# Patient Record
Sex: Female | Born: 1985 | Hispanic: Yes | Marital: Single | Smoking: Former smoker
Health system: Southern US, Community
[De-identification: ages and names within clinical notes are randomized; demographics above are authoritative.]

## PROBLEM LIST (undated history)

## (undated) ENCOUNTER — Inpatient Hospital Stay (HOSPITAL_COMMUNITY): Payer: Self-pay

## (undated) DIAGNOSIS — Z9289 Personal history of other medical treatment: Secondary | ICD-10-CM

## (undated) DIAGNOSIS — I10 Essential (primary) hypertension: Secondary | ICD-10-CM

## (undated) DIAGNOSIS — Z8759 Personal history of other complications of pregnancy, childbirth and the puerperium: Secondary | ICD-10-CM

## (undated) DIAGNOSIS — D509 Iron deficiency anemia, unspecified: Secondary | ICD-10-CM

## (undated) DIAGNOSIS — Z973 Presence of spectacles and contact lenses: Secondary | ICD-10-CM

## (undated) DIAGNOSIS — Z8619 Personal history of other infectious and parasitic diseases: Secondary | ICD-10-CM

## (undated) HISTORY — PX: NO PAST SURGERIES: SHX2092

---

## 2017-09-13 HISTORY — PX: MOUTH SURGERY: SHX715

## 2017-09-13 NOTE — L&D Delivery Note (Signed)
Delivery Note At  1002 a viable female  was delivered via  (Presentation:vtx ; loa ).  APGAR:7 ,9 ; weight  .  pending Placenta status: , .  Cord:  with the following complications:none  Anesthesia:  epidural Episiotomy:   Lacerations:  first degree lac Suture Repair: 3.0 vicryl Est. Blood Loss (mL):  100  Mom to high risk floor  Baby to Couplet care / Skin to Skin.  Lori A Clemmons CNM 05/17/2018, 10:20 AM

## 2018-01-10 ENCOUNTER — Other Ambulatory Visit (HOSPITAL_COMMUNITY): Payer: Self-pay | Admitting: Obstetrics and Gynecology

## 2018-01-10 ENCOUNTER — Encounter (HOSPITAL_COMMUNITY): Payer: Self-pay

## 2018-01-10 DIAGNOSIS — Z3689 Encounter for other specified antenatal screening: Secondary | ICD-10-CM

## 2018-01-10 LAB — OB RESULTS CONSOLE RUBELLA ANTIBODY, IGM: RUBELLA: IMMUNE

## 2018-01-10 LAB — OB RESULTS CONSOLE ABO/RH: RH TYPE: POSITIVE

## 2018-01-10 LAB — OB RESULTS CONSOLE HIV ANTIBODY (ROUTINE TESTING): HIV: NONREACTIVE

## 2018-01-10 LAB — OB RESULTS CONSOLE HEPATITIS B SURFACE ANTIGEN: HEP B S AG: NEGATIVE

## 2018-01-10 LAB — OB RESULTS CONSOLE ANTIBODY SCREEN: Antibody Screen: NEGATIVE

## 2018-01-10 LAB — OB RESULTS CONSOLE RPR: RPR: NONREACTIVE

## 2018-01-24 ENCOUNTER — Encounter (HOSPITAL_COMMUNITY): Payer: Self-pay | Admitting: *Deleted

## 2018-01-25 ENCOUNTER — Ambulatory Visit (HOSPITAL_COMMUNITY)
Admission: RE | Admit: 2018-01-25 | Discharge: 2018-01-25 | Disposition: A | Discharge status: Home / Self Care | Payer: Medicaid Other | Source: Ambulatory Visit | Attending: Obstetrics and Gynecology | Admitting: Obstetrics and Gynecology

## 2018-01-25 ENCOUNTER — Other Ambulatory Visit (HOSPITAL_COMMUNITY): Payer: Self-pay | Admitting: *Deleted

## 2018-01-25 ENCOUNTER — Other Ambulatory Visit (HOSPITAL_COMMUNITY): Payer: Self-pay | Admitting: Obstetrics and Gynecology

## 2018-01-25 ENCOUNTER — Encounter (HOSPITAL_COMMUNITY): Payer: Self-pay

## 2018-01-25 DIAGNOSIS — Z3689 Encounter for other specified antenatal screening: Secondary | ICD-10-CM | POA: Insufficient documentation

## 2018-01-25 DIAGNOSIS — Z3A21 21 weeks gestation of pregnancy: Secondary | ICD-10-CM | POA: Insufficient documentation

## 2018-01-25 DIAGNOSIS — O10919 Unspecified pre-existing hypertension complicating pregnancy, unspecified trimester: Secondary | ICD-10-CM

## 2018-01-25 DIAGNOSIS — O99212 Obesity complicating pregnancy, second trimester: Secondary | ICD-10-CM | POA: Diagnosis not present

## 2018-01-25 DIAGNOSIS — O10912 Unspecified pre-existing hypertension complicating pregnancy, second trimester: Secondary | ICD-10-CM | POA: Insufficient documentation

## 2018-01-25 HISTORY — DX: Personal history of other medical treatment: Z92.89

## 2018-01-25 HISTORY — DX: Essential (primary) hypertension: I10

## 2018-01-25 NOTE — ED Notes (Signed)
Pt reports that she has not taken her labetalol this morning.  Will take med when she gets home.

## 2018-02-21 ENCOUNTER — Ambulatory Visit (HOSPITAL_COMMUNITY)
Admission: RE | Admit: 2018-02-21 | Discharge: 2018-02-21 | Disposition: A | Discharge status: Home / Self Care | Payer: Medicaid Other | Source: Ambulatory Visit | Attending: Obstetrics and Gynecology | Admitting: Obstetrics and Gynecology

## 2018-03-01 ENCOUNTER — Encounter (HOSPITAL_COMMUNITY): Payer: Self-pay

## 2018-03-01 ENCOUNTER — Ambulatory Visit (HOSPITAL_COMMUNITY)
Admission: RE | Admit: 2018-03-01 | Discharge: 2018-03-01 | Disposition: A | Discharge status: Home / Self Care | Payer: Medicaid Other | Source: Ambulatory Visit | Attending: Obstetrics and Gynecology | Admitting: Obstetrics and Gynecology

## 2018-03-01 DIAGNOSIS — Z3A26 26 weeks gestation of pregnancy: Secondary | ICD-10-CM | POA: Diagnosis not present

## 2018-03-01 DIAGNOSIS — O10912 Unspecified pre-existing hypertension complicating pregnancy, second trimester: Secondary | ICD-10-CM | POA: Insufficient documentation

## 2018-03-01 DIAGNOSIS — O99212 Obesity complicating pregnancy, second trimester: Secondary | ICD-10-CM | POA: Insufficient documentation

## 2018-03-01 DIAGNOSIS — O10919 Unspecified pre-existing hypertension complicating pregnancy, unspecified trimester: Secondary | ICD-10-CM

## 2018-04-27 ENCOUNTER — Ambulatory Visit (INDEPENDENT_AMBULATORY_CARE_PROVIDER_SITE_OTHER): Payer: Medicaid Other

## 2018-04-27 ENCOUNTER — Encounter: Payer: Self-pay | Admitting: General Practice

## 2018-04-27 MED ORDER — BETAMETHASONE SOD PHOS & ACET 6 (3-3) MG/ML IJ SUSP: 12.0000 mg | Freq: Once | INTRAMUSCULAR | Status: DC

## 2018-04-27 NOTE — Progress Notes (Signed)
Pts BP today was very high, r-138/93.Marland Kitchen.left-156/101, per provider have pt to go immediately to her OB Dr. Rock NephewPt verbalized understanding.

## 2018-04-27 NOTE — Progress Notes (Signed)
Gina LarsenAnna Cook here for Betamethasone  Injection.  Injection administered without complication. Patient will return in 24 hours for next injection.  Henrietta Dineamela S Neal, CMA 04/27/2018  11:26 AM

## 2018-04-28 ENCOUNTER — Inpatient Hospital Stay (HOSPITAL_COMMUNITY)
Admission: AD | Admit: 2018-04-28 | Discharge: 2018-04-28 | Disposition: A | Discharge status: Home / Self Care | Payer: Medicaid Other | Source: Ambulatory Visit | Attending: Obstetrics & Gynecology | Admitting: Obstetrics & Gynecology

## 2018-04-28 ENCOUNTER — Encounter (HOSPITAL_COMMUNITY): Payer: Self-pay | Admitting: *Deleted

## 2018-04-28 DIAGNOSIS — Z87891 Personal history of nicotine dependence: Secondary | ICD-10-CM | POA: Insufficient documentation

## 2018-04-28 DIAGNOSIS — Z7982 Long term (current) use of aspirin: Secondary | ICD-10-CM | POA: Insufficient documentation

## 2018-04-28 DIAGNOSIS — O99013 Anemia complicating pregnancy, third trimester: Secondary | ICD-10-CM | POA: Diagnosis not present

## 2018-04-28 DIAGNOSIS — O10013 Pre-existing essential hypertension complicating pregnancy, third trimester: Secondary | ICD-10-CM | POA: Diagnosis not present

## 2018-04-28 DIAGNOSIS — Z3A34 34 weeks gestation of pregnancy: Secondary | ICD-10-CM | POA: Insufficient documentation

## 2018-04-28 DIAGNOSIS — O10913 Unspecified pre-existing hypertension complicating pregnancy, third trimester: Secondary | ICD-10-CM

## 2018-04-28 DIAGNOSIS — R030 Elevated blood-pressure reading, without diagnosis of hypertension: Secondary | ICD-10-CM | POA: Diagnosis present

## 2018-04-28 DIAGNOSIS — D649 Anemia, unspecified: Secondary | ICD-10-CM | POA: Diagnosis not present

## 2018-04-28 LAB — COMPREHENSIVE METABOLIC PANEL
ALK PHOS: 158 U/L — AB (ref 38–126)
ALT: 13 U/L (ref 0–44)
AST: 20 U/L (ref 15–41)
Albumin: 2.8 g/dL — ABNORMAL LOW (ref 3.5–5.0)
Anion gap: 12 (ref 5–15)
BILIRUBIN TOTAL: 0.5 mg/dL (ref 0.3–1.2)
BUN: 10 mg/dL (ref 6–20)
CALCIUM: 8.8 mg/dL — AB (ref 8.9–10.3)
CO2: 19 mmol/L — ABNORMAL LOW (ref 22–32)
Chloride: 101 mmol/L (ref 98–111)
Creatinine, Ser: 0.67 mg/dL (ref 0.44–1.00)
GFR calc Af Amer: >60 mL/min (ref >60)
GLUCOSE: 144 mg/dL — AB (ref 70–99)
Potassium: 4.1 mmol/L (ref 3.5–5.1)
Sodium: 132 mmol/L — ABNORMAL LOW (ref 135–145)
TOTAL PROTEIN: 6.9 g/dL (ref 6.5–8.1)

## 2018-04-28 LAB — CBC
HEMATOCRIT: 29.3 % — AB (ref 36.0–46.0)
HEMOGLOBIN: 9.3 g/dL — AB (ref 12.0–15.0)
MCH: 23.1 pg — ABNORMAL LOW (ref 26.0–34.0)
MCHC: 31.7 g/dL (ref 30.0–36.0)
MCV: 72.9 fL — AB (ref 78.0–100.0)
Platelets: 321 10*3/uL (ref 150–400)
RBC: 4.02 MIL/uL (ref 3.87–5.11)
RDW: 18.1 % — ABNORMAL HIGH (ref 11.5–15.5)
WBC: 8.8 10*3/uL (ref 4.0–10.5)

## 2018-04-28 LAB — PROTEIN / CREATININE RATIO, URINE
Creatinine, Urine: 68 mg/dL
Protein Creatinine Ratio: 0.29 mg/mg{Cre} — ABNORMAL HIGH (ref 0.00–0.15)
Total Protein, Urine: 20 mg/dL

## 2018-04-28 MED ORDER — FERROUS SULFATE 325 (65 FE) MG PO TBEC: 325.0000 mg | DELAYED_RELEASE_TABLET | Freq: Two times a day (BID) | ORAL | 3 refills | Status: DC

## 2018-04-28 MED ORDER — BETAMETHASONE SOD PHOS & ACET 6 (3-3) MG/ML IJ SUSP
12.0000 mg | Freq: Once | INTRAMUSCULAR | Status: AC
  Administered 2018-04-28: 12 mg via INTRAMUSCULAR
  Filled 2018-04-28: qty 2

## 2018-04-28 MED ORDER — LABETALOL HCL 100 MG PO TABS: 200.0000 mg | ORAL_TABLET | Freq: Three times a day (TID) | ORAL | 1 refills | Status: DC

## 2018-04-28 NOTE — MAU Note (Signed)
Sent from office with elevated b/p 156/110. Denies any headache or visual problems

## 2018-04-28 NOTE — MAU Provider Note (Addendum)
Chief Complaint:  Hypertension   None    HPI: Gina Cook is a 32 y.o. [phone] at 3640w5d who presents to maternity admissions reporting increased BP in office.Marland Kitchen.  Hx of Gestational hypertension in each pregnancy.  Started BP medications early in this pregnancy.  Labetalol was increased to 200mg  in am and 300 mg in pm this week.  Denies headache, blurred vision or epigastric pain.  Pt states had Betamethasone yesterday at 1100.  Will get second dose today while here.   Denies contractions, leakage of fluid or vaginal bleeding. Good fetal movement.   Pregnancy Course:   Past Medical History:  Diagnosis Date  . History of blood transfusion   . Hypertension   . Postpartum hemorrhage 01/10/2018   OB History  Gravida Para Term Preterm AB Living  5 3 3   1 3   SAB TAB Ectopic Multiple Live Births  1            # Outcome Date GA Lbr Len/2nd Weight Sex Delivery Anes PTL Lv  5 Current           4 SAB           3 Term           2 Term           1 Term            Past Surgical History:  Procedure Laterality Date  . MOUTH SURGERY  09/13/2017   History reviewed. No pertinent family history. Social History   Tobacco Use  . Smoking status: Former Games developermoker  . Smokeless tobacco: Never Used  Substance Use Topics  . Alcohol use: Not Currently  . Drug use: Not Currently   No Known Allergies Facility-Administered Medications Prior to Admission  Medication Dose Route Frequency Provider Last Rate Last Dose  . betamethasone acetate-betamethasone sodium phosphate (CELESTONE) injection 12 mg  12 mg Intramuscular Once Nigel BridgemanLatham, Vicki, CNM       Medications Prior to Admission  Medication Sig Dispense Refill Last Dose  . ASPIRIN 81 PO Take by mouth.   Taking  . LABETALOL HCL PO Take by mouth.   Taking  . Prenatal Vit-Fe Fumarate-FA (PRENATAL VITAMIN PO) Take by mouth.   Taking    I have reviewed patient's Past Medical Hx, Surgical Hx, Family Hx, Social Hx, medications and allergies.   ROS:   Review of Systems  Constitutional: Negative.   HENT: Negative.   Eyes: Negative.   Respiratory: Negative.   Cardiovascular: Negative.   Gastrointestinal: Negative.   Endocrine: Negative.   Genitourinary: Negative.   Musculoskeletal: Negative.   Skin: Negative.   Allergic/Immunologic: Negative.   Neurological: Negative.   Hematological: Negative.   Psychiatric/Behavioral: Negative.     Physical Exam   Patient Vitals for the past 24 hrs:  BP Temp Pulse Resp  04/28/18 2131 (!) 155/94 - 91 -  04/28/18 2116 (!) 156/90 - 92 -  04/28/18 2101 125/82 - (!) 104 -  04/28/18 2046 (!) 151/86 - 97 -  04/28/18 2030 (!) 168/104 - 97 -  04/28/18 2015 (!) 160/100 - 93 -  04/28/18 2000 (!) 158/102 - 97 -  04/28/18 1946 (!) 146/100 - (!) 106 -  04/28/18 1931 (!) 148/96 - (!) 102 -  04/28/18 1914 (!) 155/97 98.2 F (36.8 C) 96 18  BP at discharge 137/79 Constitutional: Well-developed, well-nourished female in no acute distress.  Cardiovascular: normal rate Respiratory: normal effort GI: Abd soft, non-tender, gravid appropriate for gestational  age. Pos BS x 4 MS: Extremities nontender, no edema, normal ROM Neurologic: Alert and oriented x 4.  GU: Deferred    FHT:  Baseline 120 , moderate variability, accelerations present, no decelerations Contractions:None   Labs: Results for orders placed or performed during the hospital encounter of 04/28/18 (from the past 24 hour(s))  Protein / creatinine ratio, urine     Status: Abnormal   Collection Time: 04/28/18  7:23 PM  Result Value Ref Range   Creatinine, Urine 68.00 mg/dL   Total Protein, Urine 20 mg/dL   Protein Creatinine Ratio 0.29 (H) 0.00 - 0.15 mg/mg[Cre]  CBC     Status: Abnormal   Collection Time: 04/28/18  7:35 PM  Result Value Ref Range   WBC 8.8 4.0 - 10.5 K/uL   RBC 4.02 3.87 - 5.11 MIL/uL   Hemoglobin 9.3 (L) 12.0 - 15.0 g/dL   HCT 04.529.3 (L) 40.936.0 - 81.146.0 %   MCV 72.9 (L) 78.0 - 100.0 fL   MCH 23.1 (L) 26.0 - 34.0 pg    MCHC 31.7 30.0 - 36.0 g/dL   RDW 91.418.1 (H) 78.211.5 - 95.615.5 %   Platelets 321 150 - 400 K/uL  Comprehensive metabolic panel     Status: Abnormal   Collection Time: 04/28/18  7:35 PM  Result Value Ref Range   Sodium 132 (L) 135 - 145 mmol/L   Potassium 4.1 3.5 - 5.1 mmol/L   Chloride 101 98 - 111 mmol/L   CO2 19 (L) 22 - 32 mmol/L   Glucose, Bld 144 (H) 70 - 99 mg/dL   BUN 10 6 - 20 mg/dL   Creatinine, Ser 2.130.67 0.44 - 1.00 mg/dL   Calcium 8.8 (L) 8.9 - 10.3 mg/dL   Total Protein 6.9 6.5 - 8.1 g/dL   Albumin 2.8 (L) 3.5 - 5.0 g/dL   AST 20 15 - 41 U/L   ALT 13 0 - 44 U/L   Alkaline Phosphatase 158 (H) 38 - 126 U/L   Total Bilirubin 0.5 0.3 - 1.2 mg/dL   GFR calc non Af Amer >60 >60 mL/min   GFR calc Af Amer >60 >60 mL/min   Anion gap 12 5 - 15    Imaging:  No results found.  MAU Course: Orders Placed This Encounter  Procedures  . Protein / creatinine ratio, urine  . CBC  . Comprehensive metabolic panel  . Fetal monitoring  . Vital signs   No orders of the defined types were placed in this encounter.   MDM: PE, labs and nst reviewed.  Discussed poc with Dr. Normand Sloopillard.  Will increase Labetalol to 200mg  in am in mid day.  300 mg in pm.  Second dose of betamethasone.  Increase iron to tid Assessment: Chronic hypertension in pregnancy at 34.5 Anemia Reactive NST  Plan: Increased Labetalol to 3 x per day Reviewed pre eclampsia signs and symptoms. F/U Monday in office Increase Iron to TID Monitor BP at home  Discharge home in stable condition.   Labor precautions and fetal kick counts     Kenney Housemanrothero, Shandale Malak Jean, CNM 04/28/2018 9:44 PM

## 2018-05-04 ENCOUNTER — Telehealth (HOSPITAL_COMMUNITY): Payer: Self-pay | Admitting: *Deleted

## 2018-05-04 NOTE — Telephone Encounter (Signed)
Preadmission screen  

## 2018-05-10 ENCOUNTER — Other Ambulatory Visit: Payer: Self-pay | Admitting: Obstetrics & Gynecology

## 2018-05-17 ENCOUNTER — Inpatient Hospital Stay (HOSPITAL_COMMUNITY)
Admission: AD | Admit: 2018-05-17 | Discharge: 2018-05-19 | DRG: 807 | Disposition: A | Discharge status: Home / Self Care | Payer: Medicaid Other | Attending: Obstetrics and Gynecology | Admitting: Obstetrics and Gynecology

## 2018-05-17 ENCOUNTER — Inpatient Hospital Stay (HOSPITAL_COMMUNITY): Payer: Medicaid Other | Admitting: Anesthesiology

## 2018-05-17 ENCOUNTER — Encounter (HOSPITAL_COMMUNITY): Payer: Self-pay

## 2018-05-17 ENCOUNTER — Other Ambulatory Visit: Payer: Self-pay

## 2018-05-17 DIAGNOSIS — O114 Pre-existing hypertension with pre-eclampsia, complicating childbirth: Secondary | ICD-10-CM | POA: Diagnosis present

## 2018-05-17 DIAGNOSIS — O1002 Pre-existing essential hypertension complicating childbirth: Secondary | ICD-10-CM | POA: Diagnosis present

## 2018-05-17 DIAGNOSIS — Z3A37 37 weeks gestation of pregnancy: Secondary | ICD-10-CM

## 2018-05-17 DIAGNOSIS — O9081 Anemia of the puerperium: Secondary | ICD-10-CM

## 2018-05-17 DIAGNOSIS — O119 Pre-existing hypertension with pre-eclampsia, unspecified trimester: Secondary | ICD-10-CM

## 2018-05-17 DIAGNOSIS — Z87891 Personal history of nicotine dependence: Secondary | ICD-10-CM

## 2018-05-17 LAB — POCT FERN TEST: POCT FERN TEST: POSITIVE

## 2018-05-17 LAB — TYPE AND SCREEN
ABO/RH(D): O POS
ANTIBODY SCREEN: NEGATIVE

## 2018-05-17 LAB — COMPREHENSIVE METABOLIC PANEL
ALBUMIN: 2.8 g/dL — AB (ref 3.5–5.0)
ALT: 12 U/L (ref 0–44)
ANION GAP: 11 (ref 5–15)
AST: 15 U/L (ref 15–41)
Alkaline Phosphatase: 182 U/L — ABNORMAL HIGH (ref 38–126)
BUN: 9 mg/dL (ref 6–20)
CHLORIDE: 103 mmol/L (ref 98–111)
CO2: 18 mmol/L — AB (ref 22–32)
Calcium: 8.8 mg/dL — ABNORMAL LOW (ref 8.9–10.3)
Creatinine, Ser: 0.51 mg/dL (ref 0.44–1.00)
GFR calc non Af Amer: >60 mL/min (ref >60)
GLUCOSE: 88 mg/dL (ref 70–99)
Potassium: 4 mmol/L (ref 3.5–5.1)
SODIUM: 132 mmol/L — AB (ref 135–145)
Total Bilirubin: 0.8 mg/dL (ref 0.3–1.2)
Total Protein: 6.9 g/dL (ref 6.5–8.1)

## 2018-05-17 LAB — CBC
HCT: 33 % — ABNORMAL LOW (ref 36.0–46.0)
Hemoglobin: 10.5 g/dL — ABNORMAL LOW (ref 12.0–15.0)
MCH: 22.5 pg — ABNORMAL LOW (ref 26.0–34.0)
MCHC: 31.8 g/dL (ref 30.0–36.0)
MCV: 70.8 fL — ABNORMAL LOW (ref 78.0–100.0)
PLATELETS: 313 10*3/uL (ref 150–400)
RBC: 4.66 MIL/uL (ref 3.87–5.11)
RDW: 20.3 % — AB (ref 11.5–15.5)
WBC: 7.9 10*3/uL (ref 4.0–10.5)

## 2018-05-17 LAB — PROTEIN / CREATININE RATIO, URINE
Creatinine, Urine: 28 mg/dL
PROTEIN CREATININE RATIO: 2.89 mg/mg{creat} — AB (ref 0.00–0.15)
TOTAL PROTEIN, URINE: 81 mg/dL

## 2018-05-17 LAB — ABO/RH: ABO/RH(D): O POS

## 2018-05-17 LAB — URIC ACID: URIC ACID, SERUM: 6.4 mg/dL (ref 2.5–7.1)

## 2018-05-17 LAB — LACTATE DEHYDROGENASE: LDH: 120 U/L (ref 98–192)

## 2018-05-17 LAB — RPR: RPR Ser Ql: NONREACTIVE

## 2018-05-17 MED ORDER — ONDANSETRON HCL 4 MG/2ML IJ SOLN: 4.0000 mg | Freq: Four times a day (QID) | INTRAMUSCULAR | Status: DC | PRN

## 2018-05-17 MED ORDER — OXYCODONE-ACETAMINOPHEN 5-325 MG PO TABS: 1.0000 | ORAL_TABLET | ORAL | Status: DC | PRN

## 2018-05-17 MED ORDER — LACTATED RINGERS IV SOLN
INTRAVENOUS | Status: DC
  Administered 2018-05-17: 03:00:00 via INTRAVENOUS

## 2018-05-17 MED ORDER — PHENYLEPHRINE 40 MCG/ML (10ML) SYRINGE FOR IV PUSH (FOR BLOOD PRESSURE SUPPORT): 80.0000 ug | PREFILLED_SYRINGE | INTRAVENOUS | Status: DC | PRN

## 2018-05-17 MED ORDER — DIPHENHYDRAMINE HCL 25 MG PO CAPS: 25.0000 mg | ORAL_CAPSULE | Freq: Four times a day (QID) | ORAL | Status: DC | PRN

## 2018-05-17 MED ORDER — LACTATED RINGERS IV SOLN
500.0000 mL | Freq: Once | INTRAVENOUS | Status: AC
  Administered 2018-05-17: 500 mL via INTRAVENOUS

## 2018-05-17 MED ORDER — HYDRALAZINE HCL 20 MG/ML IJ SOLN: 10.0000 mg | INTRAMUSCULAR | Status: DC | PRN

## 2018-05-17 MED ORDER — LABETALOL HCL 5 MG/ML IV SOLN: 80.0000 mg | INTRAVENOUS | Status: DC | PRN

## 2018-05-17 MED ORDER — TETANUS-DIPHTH-ACELL PERTUSSIS 5-2.5-18.5 LF-MCG/0.5 IM SUSP: 0.5000 mL | Freq: Once | INTRAMUSCULAR | Status: DC

## 2018-05-17 MED ORDER — IBUPROFEN 600 MG PO TABS
600.0000 mg | ORAL_TABLET | Freq: Four times a day (QID) | ORAL | Status: DC
  Administered 2018-05-17 – 2018-05-19 (×8): 600 mg via ORAL
  Filled 2018-05-17 (×8): qty 1

## 2018-05-17 MED ORDER — DIPHENHYDRAMINE HCL 50 MG/ML IJ SOLN: 12.5000 mg | INTRAMUSCULAR | Status: DC | PRN

## 2018-05-17 MED ORDER — ONDANSETRON HCL 4 MG PO TABS: 4.0000 mg | ORAL_TABLET | ORAL | Status: DC | PRN

## 2018-05-17 MED ORDER — ACETAMINOPHEN 325 MG PO TABS
650.0000 mg | ORAL_TABLET | ORAL | Status: DC | PRN
  Administered 2018-05-17: 650 mg via ORAL
  Filled 2018-05-17: qty 2

## 2018-05-17 MED ORDER — FENTANYL 2.5 MCG/ML BUPIVACAINE 1/10 % EPIDURAL INFUSION (WH - ANES)
INTRAMUSCULAR | Status: AC
  Filled 2018-05-17: qty 100

## 2018-05-17 MED ORDER — LABETALOL HCL 5 MG/ML IV SOLN
20.0000 mg | INTRAVENOUS | Status: DC | PRN
  Administered 2018-05-17: 20 mg via INTRAVENOUS
  Filled 2018-05-17: qty 4

## 2018-05-17 MED ORDER — MAGNESIUM SULFATE 40 G IN LACTATED RINGERS - SIMPLE
2.0000 g/h | INTRAVENOUS | Status: DC
  Administered 2018-05-17 – 2018-05-18 (×2): 2 g/h via INTRAVENOUS
  Filled 2018-05-17 (×2): qty 500

## 2018-05-17 MED ORDER — FENTANYL 2.5 MCG/ML BUPIVACAINE 1/10 % EPIDURAL INFUSION (WH - ANES)
14.0000 mL/h | INTRAMUSCULAR | Status: DC | PRN
  Administered 2018-05-17: 14 mL/h via EPIDURAL

## 2018-05-17 MED ORDER — SIMETHICONE 80 MG PO CHEW: 80.0000 mg | CHEWABLE_TABLET | ORAL | Status: DC | PRN

## 2018-05-17 MED ORDER — SENNOSIDES-DOCUSATE SODIUM 8.6-50 MG PO TABS
2.0000 | ORAL_TABLET | ORAL | Status: DC
  Administered 2018-05-17 – 2018-05-18 (×2): 2 via ORAL
  Filled 2018-05-17 (×2): qty 2

## 2018-05-17 MED ORDER — WITCH HAZEL-GLYCERIN EX PADS: 1.0000 "application " | MEDICATED_PAD | CUTANEOUS | Status: DC | PRN

## 2018-05-17 MED ORDER — BENZOCAINE-MENTHOL 20-0.5 % EX AERO: 1.0000 "application " | INHALATION_SPRAY | CUTANEOUS | Status: DC | PRN

## 2018-05-17 MED ORDER — ONDANSETRON HCL 4 MG/2ML IJ SOLN: 4.0000 mg | INTRAMUSCULAR | Status: DC | PRN

## 2018-05-17 MED ORDER — LIDOCAINE HCL (PF) 1 % IJ SOLN
30.0000 mL | INTRAMUSCULAR | Status: DC | PRN
  Filled 2018-05-17: qty 30

## 2018-05-17 MED ORDER — FENTANYL CITRATE (PF) 100 MCG/2ML IJ SOLN: 50.0000 ug | INTRAMUSCULAR | Status: DC | PRN

## 2018-05-17 MED ORDER — COCONUT OIL OIL: 1.0000 "application " | TOPICAL_OIL | Status: DC | PRN

## 2018-05-17 MED ORDER — EPHEDRINE 5 MG/ML INJ: 10.0000 mg | INTRAVENOUS | Status: DC | PRN

## 2018-05-17 MED ORDER — DIBUCAINE 1 % RE OINT: 1.0000 "application " | TOPICAL_OINTMENT | RECTAL | Status: DC | PRN

## 2018-05-17 MED ORDER — ZOLPIDEM TARTRATE 5 MG PO TABS: 5.0000 mg | ORAL_TABLET | Freq: Every evening | ORAL | Status: DC | PRN

## 2018-05-17 MED ORDER — OXYTOCIN 40 UNITS IN LACTATED RINGERS INFUSION - SIMPLE MED
1.0000 m[IU]/min | INTRAVENOUS | Status: DC
  Administered 2018-05-17: 2 m[IU]/min via INTRAVENOUS
  Filled 2018-05-17: qty 1000

## 2018-05-17 MED ORDER — LACTATED RINGERS IV SOLN
INTRAVENOUS | Status: DC
  Administered 2018-05-17: 07:00:00 via INTRAVENOUS

## 2018-05-17 MED ORDER — LABETALOL HCL 200 MG PO TABS
200.0000 mg | ORAL_TABLET | Freq: Every day | ORAL | Status: DC
  Administered 2018-05-17 – 2018-05-18 (×2): 200 mg via ORAL
  Filled 2018-05-17 (×2): qty 1

## 2018-05-17 MED ORDER — OXYTOCIN 40 UNITS IN LACTATED RINGERS INFUSION - SIMPLE MED
2.5000 [IU]/h | INTRAVENOUS | Status: DC
  Administered 2018-05-17: 2.5 [IU]/h via INTRAVENOUS

## 2018-05-17 MED ORDER — LABETALOL HCL 200 MG PO TABS
300.0000 mg | ORAL_TABLET | Freq: Every day | ORAL | Status: DC
  Administered 2018-05-17 – 2018-05-18 (×2): 300 mg via ORAL
  Filled 2018-05-17 (×2): qty 1

## 2018-05-17 MED ORDER — OXYCODONE-ACETAMINOPHEN 5-325 MG PO TABS: 2.0000 | ORAL_TABLET | ORAL | Status: DC | PRN

## 2018-05-17 MED ORDER — SOD CITRATE-CITRIC ACID 500-334 MG/5ML PO SOLN: 30.0000 mL | ORAL | Status: DC | PRN

## 2018-05-17 MED ORDER — LACTATED RINGERS IV SOLN: 500.0000 mL | INTRAVENOUS | Status: DC | PRN

## 2018-05-17 MED ORDER — LIDOCAINE HCL (PF) 1 % IJ SOLN
INTRAMUSCULAR | Status: DC | PRN
  Administered 2018-05-17: 13 mL via EPIDURAL

## 2018-05-17 MED ORDER — MAGNESIUM SULFATE BOLUS VIA INFUSION
4.0000 g | Freq: Once | INTRAVENOUS | Status: AC
  Administered 2018-05-17: 4 g via INTRAVENOUS
  Filled 2018-05-17: qty 500

## 2018-05-17 MED ORDER — TERBUTALINE SULFATE 1 MG/ML IJ SOLN: 0.2500 mg | Freq: Once | INTRAMUSCULAR | Status: DC | PRN

## 2018-05-17 MED ORDER — PHENYLEPHRINE 40 MCG/ML (10ML) SYRINGE FOR IV PUSH (FOR BLOOD PRESSURE SUPPORT)
80.0000 ug | PREFILLED_SYRINGE | INTRAVENOUS | Status: DC | PRN
  Filled 2018-05-17: qty 5

## 2018-05-17 MED ORDER — EPHEDRINE 5 MG/ML INJ
10.0000 mg | INTRAVENOUS | Status: DC | PRN
  Filled 2018-05-17: qty 2

## 2018-05-17 MED ORDER — PHENYLEPHRINE 40 MCG/ML (10ML) SYRINGE FOR IV PUSH (FOR BLOOD PRESSURE SUPPORT)
PREFILLED_SYRINGE | INTRAVENOUS | Status: AC
  Filled 2018-05-17: qty 10

## 2018-05-17 MED ORDER — PRENATAL MULTIVITAMIN CH
1.0000 | ORAL_TABLET | Freq: Every day | ORAL | Status: DC
  Administered 2018-05-17 – 2018-05-18 (×2): 1 via ORAL
  Filled 2018-05-17 (×2): qty 1

## 2018-05-17 MED ORDER — LACTATED RINGERS IV SOLN
INTRAVENOUS | Status: DC
  Administered 2018-05-17: 100 mL/h via INTRAVENOUS

## 2018-05-17 MED ORDER — LABETALOL HCL 5 MG/ML IV SOLN: 40.0000 mg | INTRAVENOUS | Status: DC | PRN

## 2018-05-17 MED ORDER — OXYTOCIN BOLUS FROM INFUSION
500.0000 mL | Freq: Once | INTRAVENOUS | Status: AC
  Administered 2018-05-17: 500 mL via INTRAVENOUS

## 2018-05-17 NOTE — H&P (Addendum)
Gina Cook is a 32 y.o. female presenting for labor and chronic hypertension with superimposed preeclampsia with severe features. OB History    Gravida  5   Para  3   Term  3   Preterm      AB  1   Living  3     SAB  1   TAB      Ectopic      Multiple      Live Births             Past Medical History:  Diagnosis Date  . History of blood transfusion   . Hypertension   . Postpartum hemorrhage 01/10/2018   Past Surgical History:  Procedure Laterality Date  . MOUTH SURGERY  09/13/2017   Family History: family history is not on file. Social History:  reports that she has quit smoking. She has never used smokeless tobacco. She reports that she drank alcohol. She reports that she has current or past drug history.     Maternal Diabetes: No Genetic Screening: Declined Maternal Ultrasounds/Referrals: Normal Fetal Ultrasounds or other Referrals:  Referred to Materal Fetal Medicine for fetal arrhythmia Maternal Substance Abuse:  No Significant Maternal Medications:  Meds include: Other: Labetalol 200mg  in am and 300mg  in pm Significant Maternal Lab Results:  Lab values include: Other: PCR >0.3 in the office, patient diagnosed with chronic hypertension with superimposed preeclampsia with severe features.  Other Comments:  Patient having weekly PIH labs and BPPs for chronic hypertension with superimposed preeclampsia  Review of Systems  Gastrointestinal: Positive for abdominal pain.  Neurological: Positive for headaches.  All other systems reviewed and are negative.  Maternal Medical History:  Reason for admission: Contractions.   Contractions: Onset was 6-12 hours ago.   Frequency: irregular.   Perceived severity is moderate.    Fetal activity: Perceived fetal activity is normal.   Last perceived fetal movement was within the past hour.    Prenatal Complications - Diabetes: none.     Vitals:   05/17/18 0321  BP: (!) 177/116  Pulse: 86  Resp: 20   Temp: 98.1 F (36.7 C)  TempSrc: Oral  SpO2: 100%    Maternal Exam:  Uterine Assessment: Contraction strength is moderate.  Contraction duration is 60 seconds. Contraction frequency is irregular.   Abdomen: Patient reports no abdominal tenderness. Fundal height is Size=dates.   Estimated fetal weight is 5.5lbs.   Fetal presentation: vertex  Introitus: Normal vulva. Normal vagina.  Pelvis: adequate for delivery.   Cervix: Cervix evaluated by digital exam.     Fetal Exam Fetal Monitor Review: Mode: ultrasound.   Baseline rate: 130.  Variability: moderate (6-25 bpm).   Pattern: no decelerations and no accelerations.    Fetal State Assessment: Category I - tracings are normal.     Physical Exam  Nursing note and vitals reviewed. Constitutional: She is oriented to person, place, and time. She appears well-developed and well-nourished.  HENT:  Head: Normocephalic.  Eyes: Pupils are equal, round, and reactive to light.  Cardiovascular: Normal rate, regular rhythm and normal heart sounds.  Respiratory: Effort normal and breath sounds normal.  GI: There is no tenderness.  Genitourinary: Vagina normal and uterus normal.  Musculoskeletal: Normal range of motion.  Neurological: She is alert and oriented to person, place, and time.  Skin: Skin is warm and dry.  Psychiatric: She has a normal mood and affect. Her behavior is normal. Judgment and thought content normal.    Prenatal labs: ABO, Rh:  O/Positive/-- (04/30 0000) Antibody: Negative (04/30 0000) Rubella: Immune (04/30 0000) RPR: Nonreactive (04/30 0000)  HBsAg: Negative (04/30 0000)  HIV: Non-reactive (04/30 0000)  GBS:   Negative  Assessment/Plan: 32 y.o. G5P3 at [redacted]w[redacted]d Normal labor Chronic hypertension with superimposed preeclampsia with severe features Category 1 FHTs Admit to L&D Consulted Dr. Su Hilt Start magnesium for preeclampsia with severe features Initiate IV labetalol protocol  Repeat PIH  labs Patient desires epidural  Anticipate NSVD  Gina Cook 05/17/2018, 3:28 AM

## 2018-05-17 NOTE — Anesthesia Pain Management Evaluation Note (Signed)
  CRNA Pain Management Visit Note  Patient: Gina Cook, 32 y.o., female  "Hello I am a member of the anesthesia team at [facility]. We have an anesthesia team available at all times to provide care throughout the hospital, including epidural management and anesthesia for C-section. I don't know your plan for the delivery whether it a natural birth, water birth, IV sedation, nitrous supplementation, doula or epidural, but we want to meet your pain goals."   1.Was your pain managed to your expectations on prior hospitalizations?   Yes   2.What is your expectation for pain management during this hospitalization?     Epidural  3.How can we help you reach that goal? Maintain epidural until delivery.  Record the patient's initial score and the patient's pain goal.   Pain: 4 - pressure  Pain Goal: 5 [facility] wants you to be able to say your pain was always managed very well.  Kesler Wickham 05/17/2018

## 2018-05-17 NOTE — Anesthesia Postprocedure Evaluation (Signed)
Anesthesia Post Note  Patient: Gina Cook  Procedure(s) Performed: AN AD HOC LABOR EPIDURAL     Patient location during evaluation: Mother Baby Anesthesia Type: Epidural Level of consciousness: awake and alert and oriented Pain management: pain level controlled Vital Signs Assessment: post-procedure vital signs reviewed and stable Respiratory status: spontaneous breathing and nonlabored ventilation Cardiovascular status: stable Postop Assessment: no headache, no backache, patient able to bend at knees, no signs of nausea or vomiting and adequate PO intake Anesthetic complications: no    Last Vitals:  Vitals:   05/17/18 1117 05/17/18 1209  BP: (!) 154/104 (!) 147/95  Pulse: 80 87  Resp: 16 18  Temp:  36.5 C  SpO2:  100%    Last Pain:  Vitals:   05/17/18 1209  TempSrc: Oral  PainSc:    Pain Goal: Patients Stated Pain Goal: 7 (05/17/18 0412)               Madison Hickman

## 2018-05-17 NOTE — Anesthesia Preprocedure Evaluation (Signed)
Anesthesia Evaluation  Patient identified by MRN, date of birth, ID band Patient awake    Reviewed: Allergy & Precautions, NPO status , Patient's Chart, lab work & pertinent test results  Airway Mallampati: II  TM Distance: >3 FB Neck ROM: Full    Dental no notable dental hx.    Pulmonary neg pulmonary ROS, former smoker,    Pulmonary exam normal breath sounds clear to auscultation       Cardiovascular hypertension, negative cardio ROS Normal cardiovascular exam Rhythm:Regular Rate:Normal     Neuro/Psych negative neurological ROS  negative psych ROS   GI/Hepatic negative GI ROS, Neg liver ROS,   Endo/Other  negative endocrine ROS  Renal/GU negative Renal ROS  negative genitourinary   Musculoskeletal negative musculoskeletal ROS (+)   Abdominal   Peds negative pediatric ROS (+)  Hematology negative hematology ROS (+)   Anesthesia Other Findings   Reproductive/Obstetrics negative OB ROS (+) Pregnancy                             Anesthesia Physical Anesthesia Plan  ASA: II  Anesthesia Plan: Epidural   Post-op Pain Management:    Induction:   PONV Risk Score and Plan:   Airway Management Planned:   Additional Equipment:   Intra-op Plan:   Post-operative Plan:   Informed Consent: I have reviewed the patients History and Physical, chart, labs and discussed the procedure including the risks, benefits and alternatives for the proposed anesthesia with the patient or authorized representative who has indicated his/her understanding and acceptance.   Dental advisory given  Plan Discussed with: CRNA  Anesthesia Plan Comments:         Anesthesia Quick Evaluation

## 2018-05-17 NOTE — Progress Notes (Signed)
Post Partum Day 1 Subjective: no complaints, up ad lib, voiding and tolerating PO. Denies headache, blurry vision, or epigastric pain.   Objective: Vitals:   05/18/18 0600 05/18/18 0607  BP:  99/62  Pulse:  77  Resp: 18 18  Temp:    SpO2: 99% 99%    Physical Exam:  General: alert and cooperative Lochia: appropriate Uterine Fundus: firm Incision: n/a DVT Evaluation: No evidence of DVT seen on physical exam. Negative Homan's sign. No cords or calf tenderness. No significant calf/ankle edema.  Recent Labs    05/17/18 0308  HGB 10.5*  HCT 33.0*    Assessment/Plan: Plan for discharge tomorrow, does not desire circumcision.  Patient with low blood pressure, hold parameters placed on PO Labetalol    LOS: 1 day   Gina Cook 05/18/2018, 6:16 AM

## 2018-05-17 NOTE — Anesthesia Procedure Notes (Signed)
Epidural Patient location during procedure: OB Start time: 05/17/2018 7:02 AM End time: 05/17/2018 7:18 AM  Staffing Anesthesiologist: Lowella Curb, MD Performed: anesthesiologist   Preanesthetic Checklist Completed: patient identified, site marked, surgical consent, pre-op evaluation, timeout performed, IV checked, risks and benefits discussed and monitors and equipment checked  Epidural Patient position: sitting Prep: ChloraPrep Patient monitoring: heart rate, cardiac monitor, continuous pulse ox and blood pressure Approach: midline Location: L2-L3 Injection technique: LOR saline  Needle:  Needle type: Tuohy  Needle gauge: 17 G Needle length: 9 cm Needle insertion depth: 5 cm Catheter type: closed end flexible Catheter size: 20 Guage Catheter at skin depth: 9 cm Test dose: negative  Assessment Events: blood not aspirated, injection not painful, no injection resistance, negative IV test and no paresthesia  Additional Notes Reason for block:procedure for pain

## 2018-05-17 NOTE — Progress Notes (Signed)
Dr Hyacinth Meeker called, report given re: PreE pt with epidural post delivery with magnesium sulfate & previous plts of 313.  Orders rec'd to remove epidural at this time.

## 2018-05-17 NOTE — MAU Note (Addendum)
Pt. Reports leaking and ctx that started at 2000. Reports 6/10  Pain +FM.

## 2018-05-17 NOTE — Progress Notes (Signed)
Patient requested bottle-formula for baby Enfamil. Lead explain to patient and she still requested formula.

## 2018-05-17 NOTE — Progress Notes (Signed)
Gina Cook is a 32 y.o. [phone] at [redacted]w[redacted]d admitted for labor, rupture of membranes  Subjective: Patient is becoming more uncomfortable and wants an epidural.   Objective: Vitals:   05/17/18 0711 05/17/18 0712  BP: (!) 158/101 (!) 141/80  Pulse: 87 89  Resp:    Temp:    SpO2:  98%  , FHT:  FHR: 130 bpm, variability: moderate,  accelerations:  Abscent,  decelerations:  Absent UC:   irregular, every 4-6 minutes SVE:   Dilation: 5.5 Effacement (%): 70 Station: -2 Exam by:: Kathalene Frames, CNM  Labs: Lab Results  Component Value Date   WBC 7.9 05/17/2018   HGB 10.5 (L) 05/17/2018   HCT 33.0 (L) 05/17/2018   MCV 70.8 (L) 05/17/2018   PLT 313 05/17/2018    Assessment / Plan: Spontaneous labor, progressing normally  Labor: Progressing normally Preeclampsia:  on magnesium sulfate, no signs or symptoms of toxicity, intake and ouput balanced and labs stable Fetal Wellbeing:  Category I Pain Control:  Epidural I/D:  n/a Anticipated MOD:  NSVD  [name] 05/17/2018, 7:18 AM

## 2018-05-17 NOTE — Progress Notes (Signed)
Pt assisted UOB to urinate and states," I did not feel the sensation to urinate until I got to the bathroom". Void=568ml. Previous void=510ml @1453 . Addison P RN made aware.   Adah Perl RN

## 2018-05-18 ENCOUNTER — Inpatient Hospital Stay (HOSPITAL_COMMUNITY): Admission: RE | Admit: 2018-05-18 | Payer: Medicaid Other | Source: Ambulatory Visit

## 2018-05-18 LAB — CBC
HCT: 24.2 % — ABNORMAL LOW (ref 36.0–46.0)
Hemoglobin: 7.7 g/dL — ABNORMAL LOW (ref 12.0–15.0)
MCH: 22.9 pg — ABNORMAL LOW (ref 26.0–34.0)
MCHC: 31.8 g/dL (ref 30.0–36.0)
MCV: 72 fL — ABNORMAL LOW (ref 78.0–100.0)
Platelets: 255 10*3/uL (ref 150–400)
RBC: 3.36 MIL/uL — ABNORMAL LOW (ref 3.87–5.11)
RDW: 19.5 % — ABNORMAL HIGH (ref 11.5–15.5)
WBC: 7.2 10*3/uL (ref 4.0–10.5)

## 2018-05-18 LAB — BIRTH TISSUE RECOVERY COLLECTION (PLACENTA DONATION)

## 2018-05-18 NOTE — Plan of Care (Signed)
  Problem: Fluid Volume: Goal: Peripheral tissue perfusion will improve Outcome: Progressing   Problem: Clinical Measurements: Goal: Complications related to disease process, condition or treatment will be avoided or minimized Outcome: Progressing   

## 2018-05-18 NOTE — Lactation Note (Signed)
This note was copied from a baby's chart. Lactation Consultation Note  Patient Name: Boy Etna Widner [id number] Date: 05/18/2018 Reason for consult: Initial assessment CHTN, on medication  Visited with P4 Mom of ET infant at 25 hrs old.  Mom has been recently formula feeding as baby is gagging at the breast, and "doesn't like it".  With her prior 3 children, she quit trying fairly quickly.  She also shared with LC that she will be very busy with other 3 children, ages 42,5, and 7.  Validated Mom's feelings saying we wanted to assist her with her choice of feeding.  Reassured her that ET baby's often are sleepy and a little disorganized with breastfeeding initially.  Recommended double pumping to support a full milk supply and following up with OP lactation.  Offered to set up pump and assist her with first pumping.  Mom stated she would let her nurse know if she would like to start pumping.    Lactation brochure left with Mom.  Mom aware of IP and OP lactation resources available to her.  Encouraged her to ask for help prn.  Consult Status Consult Status: Follow-up Date: 05/19/18 Follow-up type: In-patient    Judee Clara 05/18/2018, 12:42 PM

## 2018-05-19 ENCOUNTER — Ambulatory Visit: Payer: Self-pay

## 2018-05-19 DIAGNOSIS — O9081 Anemia of the puerperium: Secondary | ICD-10-CM

## 2018-05-19 DIAGNOSIS — O119 Pre-existing hypertension with pre-eclampsia, unspecified trimester: Secondary | ICD-10-CM

## 2018-05-19 MED ORDER — LABETALOL HCL 200 MG PO TABS: 400.0000 mg | ORAL_TABLET | Freq: Two times a day (BID) | ORAL | 0 refills | Status: DC

## 2018-05-19 MED ORDER — LABETALOL HCL 200 MG PO TABS
400.0000 mg | ORAL_TABLET | Freq: Two times a day (BID) | ORAL | Status: DC
  Administered 2018-05-19: 400 mg via ORAL
  Filled 2018-05-19 (×2): qty 2

## 2018-05-19 NOTE — Lactation Note (Signed)
This note was copied from a baby's chart. Lactation Consultation Note  Patient Name: Gina Cook [id number] Date: 05/19/2018 Reason for consult: Follow-up assessment  Mom waiting on transportation to discharge her.  Denies need at this time.  Mom to call as needed Maternal Data    Feeding Feeding Type: Bottle Fed - Formula  LATCH Score                   Interventions    Lactation Tools Discussed/Used     Consult Status Consult Status: Complete Date: 05/19/18 Follow-up type: Call as needed    [facility] 05/19/2018, 3:08 PM

## 2018-05-19 NOTE — Discharge Summary (Signed)
SVD OB Discharge Summary     Patient Name: Gina Cook DOB: [birth year] MRN: [id number]  Date of admission: 05/17/2018 Delivering MD: Illene Bolus A  Date of delivery: 05/17/2018 Type of delivery: SVD  Newborn Data: Sex: BB Circumcision: No Live born female  Birth Weight: 6 lb 13.2 oz (3095 g) APGAR: 7, 9  Newborn Delivery   Birth date/time:  05/17/2018 10:02:00 Delivery type:  Vaginal, Spontaneous     Feeding: breast and bottle Infant being discharge to home with mother in stable condition.   Admitting diagnosis: 37WKS CTX Intrauterine pregnancy: [redacted]w[redacted]d     Secondary diagnosis:  Active Problems:   Normal labor   Chronic hypertension with superimposed pre-eclampsia   Anemia, postpartum                                Complications: None                                                              Intrapartum Procedures: spontaneous vaginal delivery Postpartum Procedures: 24 hours s/p magnesium for Chronic hypertension with superimposed preeclampsia with severe  Complications-Operative and Postpartum: 1st degree repair degree perineal laceration Augmentation: Pitocin   History of Present Illness: Ms. Gina Cook is a 32 y.o. female, [id number], who presents at [redacted]w[redacted]d weeks gestation. The patient has been followed at  [facility]  Her pregnancy has been complicated by: Patient Active Problem List   Diagnosis Date Noted  . Chronic hypertension with superimposed pre-eclampsia 05/19/2018  . Anemia, postpartum 05/19/2018  . Normal labor 05/17/2018   Hospital course:  Onset of Labor With Vaginal Delivery     32 y.o. yo [id number] at [redacted]w[redacted]d was admitted in Latent Labor on 05/17/2018. Patient had an uncomplicated labor course as follows:  Membrane Rupture Time/Date: 2:34 AM ,05/17/2018   Intrapartum Procedures: Episiotomy: None [1]                                         Lacerations:  1st degree [2]  Patient had a delivery of a Viable  infant. 05/17/2018  Information for the patient's newborn:  Gina, Cook [[id number]]  Delivery Method: Vag-Spont    Pateint had an uncomplicated postpartum course.  She is ambulating, tolerating a regular diet, passing flatus, and urinating well. Patient is discharged home in stable condition on 05/19/18.  Postpartum Day # 2 : S/P NSVD due to spontaneous labor with Chronic hypertension with superimposed preeclampsia with severe. Pt found in bed sleeping with infant asleep in bassinet at bedside.  Patient up ad lib, denies syncope or dizziness. Reports consuming regular diet without issues and denies N/V. Patient reports 0 bowel movement + passing flatus.  Denies issues with urination and reports bleeding is "light."  Patient is Breast and bottle feeding and reports going well.  Desires BTL at 6 wks PPV for postpartum contraception.  Pain is being appropriately managed with use of motrin. Pt has asymptomatic anemia and verbalizes feeling fine.   Physical exam  Vitals:   05/18/18 2149 05/18/18 2324 05/19/18 0321 05/19/18 0733  BP: 131/78 (!) 145/89 140/89 140/89  Pulse: 93 90 80 90  Resp:  18 20 18   Temp:  98.6 F (37 C) 98.3 F (36.8 C) 98.4 F (36.9 C)  TempSrc:  Oral Oral Oral  SpO2:  98% 99% 98%  Weight:      Height:       General: alert, cooperative and no distress Lochia: appropriate Uterine Fundus: firm Perineum: Approximate, no erythema nor hematomas appreciated.  DVT Evaluation: No evidence of DVT seen on physical exam. Negative Homan's sign. No cords or calf tenderness. No significant calf/ankle edema.  Labs: Lab Results  Component Value Date   WBC 7.2 05/18/2018   HGB 7.7 (L) 05/18/2018   HCT 24.2 (L) 05/18/2018   MCV 72.0 (L) 05/18/2018   PLT 255 05/18/2018   CMP Latest Ref Rng & Units 05/17/2018  Glucose 70 - 99 mg/dL 88  BUN 6 - 20 mg/dL 9  Creatinine 9.79 - 8.92 mg/dL 1.19  Sodium 417 - 408 mmol/L 132(L)  Potassium 3.5 - 5.1 mmol/L 4.0  Chloride 98 -  111 mmol/L 103  CO2 22 - 32 mmol/L 18(L)  Calcium 8.9 - 10.3 mg/dL 1.4(G)  Total Protein 6.5 - 8.1 g/dL 6.9  Total Bilirubin 0.3 - 1.2 mg/dL 0.8  Alkaline Phos 38 - 126 U/L 182(H)  AST 15 - 41 U/L 15  ALT 0 - 44 U/L 12    Date of discharge: 05/19/2018 Discharge Diagnoses: Term Pregnancy-delivered and Chronic hypertension with superimposed preeclampsia with severe  Discharge instruction: per After Visit Summary and "Baby and Me Booklet".  After visit meds:  Allergies as of 05/19/2018   No Known Allergies     Medication List    STOP taking these medications   ASPIRIN 81 PO     TAKE these medications   ferrous sulfate 325 (65 FE) MG EC tablet Take 1 tablet (325 mg total) by mouth 2 (two) times daily.   labetalol 200 MG tablet Commonly known as:  NORMODYNE Take 2 tablets (400 mg total) by mouth 2 (two) times daily. What changed:    medication strength  how much to take  when to take this  additional instructions   PRENATAL VITAMIN PO Take by mouth.       Activity:           pelvic rest Advance as tolerated. Pelvic rest for 6 weeks.  Diet:                routine Medications: PNV, Ibuprofen, Colace and Iron Postpartum contraception: Tubal Ligation. Please call to make 6 weeks PP BTL appointment.  Condition:  Pt discharge to home with baby in stable Anemia: Tale Iron BID. Chronic hypertension with superimposed preeclampsia with severe : Baby Love to visit pt at home for BP in 2 days, will f/u in office at 1 week for BP check as well. Check BP at home if dizzy getting out of bed check BP if <120/80 please call office before taking medication. Take labetalol 400mg  BID per Dr Su Hilt will be re evaluated at office in one week   Needs repeat BP in four hours post being given this morning dose of 400mg  Labetalol, if >160/100 or <120/80, do not discharge pt, and call me.   Meds: Allergies as of 05/19/2018   No Known Allergies     Medication List    STOP taking these  medications   ASPIRIN 81 PO     TAKE these medications   ferrous sulfate 325 (65 FE) MG EC tablet Take  1 tablet (325 mg total) by mouth 2 (two) times daily.   labetalol 200 MG tablet Commonly known as:  NORMODYNE Take 2 tablets (400 mg total) by mouth 2 (two) times daily. What changed:    medication strength  how much to take  when to take this  additional instructions   PRENATAL VITAMIN PO Take by mouth.       Discharge Follow Up:  Follow-up Information    [facility] Follow up in 1 week(s).   Specialty:  Obstetrics and Gynecology Why:  1 weeks BP check and postpartum f/u with 6 week PPV.  Contact information: [address] [place] [address] [phone]           Nikolai, NP-C, CNM 05/19/2018, 9:27 AM  Dale [place], FNP

## 2018-05-19 NOTE — Plan of Care (Signed)
  Problem: Fluid Volume: Goal: Peripheral tissue perfusion will improve Outcome: Progressing   Problem: Clinical Measurements: Goal: Complications related to disease process, condition or treatment will be avoided or minimized Outcome: Progressing   

## 2018-05-19 NOTE — Progress Notes (Signed)
I assumed care of pt now. Per Dr Su Hilt in report pt to be placed on 400mg  labetalol BID and held to ensure pt BP normalize, then may be discharged home.

## 2019-05-22 ENCOUNTER — Other Ambulatory Visit: Payer: Self-pay | Admitting: Obstetrics and Gynecology

## 2019-06-28 NOTE — H&P (Addendum)
Gina Cook is a 33 y.o.  female, P: 3-1-1-4 presenting for tubal sterilization because of her desire to cease childbearing. The patient has used oral contraceptives in the past, the IUD and currently Nexplanon. Her periods typically would last for 5 days with a pad change every 4 hours and minimal cramping. 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 Though the patient is aware of the various methods of contraception she has chosen definitive therapy in the form of sterilization.  Past Medical History  OB History: G: 5; P: 3-1-1-4;  SVB: 2012, 2014, 2017 and 2018  GYN History: menarche: 11    LMP: 06/03/2019    Contraception: Nexplanon; Has a remote history of chlamydia.  Denies history of abnormal PAP smear.   Last PAP smear: 2020 normal with negative HPV  Medical History:  Anemia and Pregnancy Induced Hypertension  Surgical History: Negative  Denies problems with anesthesia. Admits to  a history of blood transfusions related to childbirth in 2012 and 2014  Family History: Hypertension,  Diabetes Mellitus and  Hypercholesterolemia  Social History: Single and employed as a Scientist, water quality; Former smoker but denies  alcohol use  Medications: Multivitamin daily Nexplanon 68 mg subdermal implant placed 07/07/2018    Denies sensitivity to peanuts, shellfish, soy, latex or adhesives.   No Known Allergies  ROS: Admits to glasses and occasional headaches relieved with Tylenol but denies vision changes, nasal congestion, dysphagia, tinnitus, dizziness, hoarseness, cough,  chest pain, shortness of breath, nausea, vomiting, diarrhea,constipation,  urinary frequency, urgency  dysuria, hematuria, vaginitis symptoms, pelvic pain, swelling of joints,easy bruising,  myalgias, arthralgias, skin rashes, unexplained weight loss and except as is mentioned in the history of present illness, patient's review of systems is otherwise  negative.     Physical Exam  Bp: 120/80  P: 82 bpm  R: 16  Temperature: 97.3 degrees F orally  Weight: 190 lbs. Height: 5\' 2"   BMI: 34.8  Neck: supple without masses or thyromegaly Lungs: clear to auscultation Heart: regular rate and rhythm Abdomen: soft, non-tender and no organomegaly Pelvic:EGBUS- wnl; vagina-normal rugae; uterus-normal size, cervix without lesions or motion tenderness; adnexae-no tenderness or masses Extremities:  no clubbing, cyanosis or edema   Assesment: Desire for Sterilization   Disposition:  A discussion was held with patient regarding the indication for her procedure(s) along with the risks, which include but are not limited to: reaction to anesthesia, damage to adjacent organs, infection and  excessive bleeding. The patient verbalized understanding of these risks and has consented to proceed with Bilateral Tubal Sterilization (fulguration) at [facility] on July 19, 2019 at 1:30 p.m.   CSN# [id number]   [place] J. Florene Glen, PA-C  for Dr.Samie Barclift A. Raniyah Curenton   Date of Initial H&P: 06/28/19  History reviewed, patient examined, no change in status, stable for surgery. Will reevaluate her for HTN at post op visit.  Will remove nexplanon now.  R&B reviewed

## 2019-07-03 ENCOUNTER — Other Ambulatory Visit: Payer: Self-pay | Admitting: Obstetrics and Gynecology

## 2019-07-16 ENCOUNTER — Other Ambulatory Visit (HOSPITAL_COMMUNITY)
Admission: RE | Admit: 2019-07-16 | Discharge: 2019-07-16 | Disposition: A | Discharge status: Home / Self Care | Payer: Medicaid Other | Source: Ambulatory Visit | Attending: Obstetrics and Gynecology | Admitting: Obstetrics and Gynecology

## 2019-07-16 DIAGNOSIS — Z20828 Contact with and (suspected) exposure to other viral communicable diseases: Secondary | ICD-10-CM | POA: Diagnosis not present

## 2019-07-16 DIAGNOSIS — Z01812 Encounter for preprocedural laboratory examination: Secondary | ICD-10-CM | POA: Diagnosis not present

## 2019-07-17 LAB — NOVEL CORONAVIRUS, NAA (HOSP ORDER, SEND-OUT TO REF LAB; TAT 18-24 HRS): SARS-CoV-2, NAA: NOT DETECTED

## 2019-07-18 ENCOUNTER — Encounter (HOSPITAL_BASED_OUTPATIENT_CLINIC_OR_DEPARTMENT_OTHER): Payer: Self-pay | Admitting: *Deleted

## 2019-07-18 ENCOUNTER — Other Ambulatory Visit: Payer: Self-pay

## 2019-07-18 NOTE — Progress Notes (Signed)
Spoke w/ via phone for pre-op interview--- PT Lab needs dos---- Hg, Urine preg COVID test ------ 07-16-2019 Arrive at ------- 1115 NPO after ------ MN w/ exception clear liquids until 0700 then nothing by mouth (no cream/ milk products) Medications to take morning of surgery ----- NONE Diabetic medication ----- n/a Patient Special Instructions ----- n/a Pre-Op special Istructions ----- n/a Patient verbalized understanding of instructions that were given at this phone interview. Patient denies shortness of breath, chest pain, fever, cough a this phone interview.

## 2019-07-19 ENCOUNTER — Ambulatory Visit (HOSPITAL_BASED_OUTPATIENT_CLINIC_OR_DEPARTMENT_OTHER): Payer: Medicaid Other | Admitting: Anesthesiology

## 2019-07-19 ENCOUNTER — Encounter (HOSPITAL_BASED_OUTPATIENT_CLINIC_OR_DEPARTMENT_OTHER)
Admission: RE | Disposition: A | Discharge status: Home / Self Care | Payer: Self-pay | Source: Home / Self Care | Attending: Obstetrics and Gynecology

## 2019-07-19 ENCOUNTER — Ambulatory Visit (HOSPITAL_BASED_OUTPATIENT_CLINIC_OR_DEPARTMENT_OTHER)
Admission: RE | Admit: 2019-07-19 | Discharge: 2019-07-19 | Disposition: A | Discharge status: Home / Self Care | Payer: Medicaid Other | Attending: Obstetrics and Gynecology | Admitting: Obstetrics and Gynecology

## 2019-07-19 ENCOUNTER — Encounter (HOSPITAL_BASED_OUTPATIENT_CLINIC_OR_DEPARTMENT_OTHER): Payer: Self-pay

## 2019-07-19 ENCOUNTER — Other Ambulatory Visit: Payer: Self-pay

## 2019-07-19 DIAGNOSIS — Z302 Encounter for sterilization: Secondary | ICD-10-CM | POA: Diagnosis not present

## 2019-07-19 DIAGNOSIS — Z3049 Encounter for surveillance of other contraceptives: Secondary | ICD-10-CM | POA: Diagnosis not present

## 2019-07-19 DIAGNOSIS — Z87891 Personal history of nicotine dependence: Secondary | ICD-10-CM | POA: Diagnosis not present

## 2019-07-19 HISTORY — DX: Iron deficiency anemia, unspecified: D50.9

## 2019-07-19 HISTORY — PX: REMOVAL OF DRUG DELIVERY IMPLANT: SHX6585

## 2019-07-19 HISTORY — DX: Personal history of other complications of pregnancy, childbirth and the puerperium: Z87.59

## 2019-07-19 HISTORY — DX: Personal history of other infectious and parasitic diseases: Z86.19

## 2019-07-19 HISTORY — PX: LAPAROSCOPIC TUBAL LIGATION: SHX1937

## 2019-07-19 HISTORY — DX: Presence of spectacles and contact lenses: Z97.3

## 2019-07-19 LAB — POCT PREGNANCY, URINE: Preg Test, Ur: NEGATIVE

## 2019-07-19 SURGERY — LIGATION, FALLOPIAN TUBE, LAPAROSCOPIC
Anesthesia: General | Site: Arm Upper | Laterality: Left

## 2019-07-19 MED ORDER — LACTATED RINGERS IV SOLN
INTRAVENOUS | Status: DC
  Administered 2019-07-19: 50 mL/h via INTRAVENOUS
  Administered 2019-07-19: 14:00:00 via INTRAVENOUS
  Filled 2019-07-19: qty 1000

## 2019-07-19 MED ORDER — OXYCODONE HCL 5 MG PO TABS
5.0000 mg | ORAL_TABLET | Freq: Once | ORAL | Status: AC | PRN
  Administered 2019-07-19: 5 mg via ORAL
  Filled 2019-07-19: qty 1

## 2019-07-19 MED ORDER — LIDOCAINE HCL (CARDIAC) PF 100 MG/5ML IV SOSY
PREFILLED_SYRINGE | INTRAVENOUS | Status: DC | PRN
  Administered 2019-07-19: 100 mg via INTRAVENOUS

## 2019-07-19 MED ORDER — SILVER NITRATE-POT NITRATE 75-25 % EX MISC
CUTANEOUS | Status: DC | PRN
  Administered 2019-07-19: 1

## 2019-07-19 MED ORDER — SUGAMMADEX SODIUM 200 MG/2ML IV SOLN
INTRAVENOUS | Status: DC | PRN
  Administered 2019-07-19: 225 mg via INTRAVENOUS

## 2019-07-19 MED ORDER — OXYCODONE-ACETAMINOPHEN 2.5-325 MG PO TABS: 1.0000 | ORAL_TABLET | ORAL | 0 refills | Status: AC | PRN

## 2019-07-19 MED ORDER — FENTANYL CITRATE (PF) 100 MCG/2ML IJ SOLN
INTRAMUSCULAR | Status: DC | PRN
  Administered 2019-07-19 (×2): 50 ug via INTRAVENOUS
  Administered 2019-07-19 (×2): 25 ug via INTRAVENOUS
  Administered 2019-07-19: 50 ug via INTRAVENOUS

## 2019-07-19 MED ORDER — DEXAMETHASONE SODIUM PHOSPHATE 4 MG/ML IJ SOLN
INTRAMUSCULAR | Status: DC | PRN
  Administered 2019-07-19: 5 mg via INTRAVENOUS

## 2019-07-19 MED ORDER — OXYCODONE HCL 5 MG PO TABS
ORAL_TABLET | ORAL | Status: AC
  Filled 2019-07-19: qty 1

## 2019-07-19 MED ORDER — MIDAZOLAM HCL 5 MG/5ML IJ SOLN
INTRAMUSCULAR | Status: DC | PRN
  Administered 2019-07-19: 2 mg via INTRAVENOUS

## 2019-07-19 MED ORDER — OXYCODONE HCL 5 MG/5ML PO SOLN
5.0000 mg | Freq: Once | ORAL | Status: AC | PRN
  Filled 2019-07-19: qty 5

## 2019-07-19 MED ORDER — KETOROLAC TROMETHAMINE 30 MG/ML IJ SOLN
30.0000 mg | Freq: Once | INTRAMUSCULAR | Status: DC | PRN
  Filled 2019-07-19: qty 1

## 2019-07-19 MED ORDER — PROMETHAZINE HCL 25 MG/ML IJ SOLN
6.2500 mg | INTRAMUSCULAR | Status: DC | PRN
  Filled 2019-07-19: qty 1

## 2019-07-19 MED ORDER — IBUPROFEN 600 MG PO TABS: 600.0000 mg | ORAL_TABLET | Freq: Four times a day (QID) | ORAL | 0 refills | Status: AC | PRN

## 2019-07-19 MED ORDER — FENTANYL CITRATE (PF) 100 MCG/2ML IJ SOLN
INTRAMUSCULAR | Status: AC
  Filled 2019-07-19: qty 2

## 2019-07-19 MED ORDER — ROCURONIUM BROMIDE 100 MG/10ML IV SOLN
INTRAVENOUS | Status: DC | PRN
  Administered 2019-07-19: 10 mg via INTRAVENOUS
  Administered 2019-07-19: 50 mg via INTRAVENOUS

## 2019-07-19 MED ORDER — BUPIVACAINE HCL (PF) 0.25 % IJ SOLN
INTRAMUSCULAR | Status: DC | PRN
  Administered 2019-07-19: 20 mL
  Administered 2019-07-19: 1 mL

## 2019-07-19 MED ORDER — PROPOFOL 10 MG/ML IV BOLUS
INTRAVENOUS | Status: DC | PRN
  Administered 2019-07-19: 150 mg via INTRAVENOUS
  Administered 2019-07-19: 50 mg via INTRAVENOUS

## 2019-07-19 MED ORDER — ONDANSETRON HCL 4 MG/2ML IJ SOLN
INTRAMUSCULAR | Status: DC | PRN
  Administered 2019-07-19: 4 mg via INTRAVENOUS

## 2019-07-19 MED ORDER — KETOROLAC TROMETHAMINE 30 MG/ML IJ SOLN
INTRAMUSCULAR | Status: DC | PRN
  Administered 2019-07-19: 30 mg via INTRAVENOUS

## 2019-07-19 MED ORDER — KETOROLAC TROMETHAMINE 30 MG/ML IJ SOLN
INTRAMUSCULAR | Status: AC
  Filled 2019-07-19: qty 1

## 2019-07-19 MED ORDER — FENTANYL CITRATE (PF) 100 MCG/2ML IJ SOLN
25.0000 ug | INTRAMUSCULAR | Status: DC | PRN
  Administered 2019-07-19 (×2): 25 ug via INTRAVENOUS
  Administered 2019-07-19: 50 ug via INTRAVENOUS
  Filled 2019-07-19: qty 1

## 2019-07-19 SURGICAL SUPPLY — 28 items
CLOSURE WOUND 1/2 X4 (GAUZE/BANDAGES/DRESSINGS) ×1
COVER MAYO STAND STRL (DRAPES) ×2 IMPLANT
DERMABOND ADVANCED (GAUZE/BANDAGES/DRESSINGS) ×2
DERMABOND ADVANCED .7 DNX12 (GAUZE/BANDAGES/DRESSINGS) IMPLANT
GAUZE SPONGE 4X4 12PLY STRL (GAUZE/BANDAGES/DRESSINGS) ×2 IMPLANT
GAUZE SPONGE 4X4 12PLY STRL LF (GAUZE/BANDAGES/DRESSINGS) ×2 IMPLANT
GLOVE BIO SURGEON STRL SZ 6.5 (GLOVE) ×4 IMPLANT
GLOVE BIO SURGEONS STRL SZ 6.5 (GLOVE) ×2
GLOVE BIOGEL PI IND STRL 7.0 (GLOVE) ×4 IMPLANT
GLOVE BIOGEL PI IND STRL 7.5 (GLOVE) IMPLANT
GLOVE BIOGEL PI INDICATOR 7.0 (GLOVE) ×6
GLOVE BIOGEL PI INDICATOR 7.5 (GLOVE) ×4
GLOVE SURG SS PI 7.5 STRL IVOR (GLOVE) ×4 IMPLANT
GOWN STRL REUS W/TWL LRG LVL3 (GOWN DISPOSABLE) ×8 IMPLANT
HIBICLENS CHG 4% 4OZ BTL (MISCELLANEOUS) ×4 IMPLANT
PACK LAPAROSCOPY BASIN (CUSTOM PROCEDURE TRAY) ×4 IMPLANT
PACK TRENDGUARD 450 HYBRID PRO (MISCELLANEOUS) IMPLANT
PROTECTOR NERVE ULNAR (MISCELLANEOUS) ×8 IMPLANT
SET TUBE SMOKE EVAC HIGH FLOW (TUBING) ×4 IMPLANT
STRIP CLOSURE SKIN 1/2X4 (GAUZE/BANDAGES/DRESSINGS) ×1 IMPLANT
SUT MNCRL AB 3-0 PS2 27 (SUTURE) ×4 IMPLANT
SUT VICRYL 0 UR6 27IN ABS (SUTURE) ×4 IMPLANT
SWABSTICK POVIDONE IODINE SNGL (MISCELLANEOUS) ×2 IMPLANT
TOWEL OR 17X26 10 PK STRL BLUE (TOWEL DISPOSABLE) ×6 IMPLANT
TRAY FOLEY W/BAG SLVR 14FR LF (SET/KITS/TRAYS/PACK) ×2 IMPLANT
TRENDGUARD 450 HYBRID PRO PACK (MISCELLANEOUS) ×4
TROCAR BALLN 12MMX100 BLUNT (TROCAR) ×4 IMPLANT
TROCAR BLADELESS OPT 5 100 (ENDOMECHANICALS) ×4 IMPLANT

## 2019-07-19 NOTE — Discharge Instructions (Signed)
Laparoscopic Tubal Ligation Laparoscopic tubal ligation is a procedure to close the fallopian tubes. This is done so that you cannot get pregnant. When the fallopian tubes are closed, the eggs that your ovaries release cannot enter the uterus, and sperm cannot reach the released eggs. You should not have this procedure if you want to get pregnant someday or if you are unsure about having more children. Tell a health care provider about:  Any allergies you have.  All medicines you are taking, including vitamins, herbs, eye drops, creams, and over-the-counter medicines.  Any problems you or family members have had with anesthetic medicines.  Any blood disorders you have.  Any surgeries you have had.  Any medical conditions you have.  Whether you are pregnant or may be pregnant.  Any past pregnancies. What are the risks? Generally, this is a safe procedure. However, problems may occur, including:  Infection.  Bleeding.  Injury to other organs in the abdomen.  Side effects from anesthetic medicines.  Failure of the procedure. This procedure can increase your risk of a kind of pregnancy in which a fertilized egg attaches to the outside of the uterus (ectopic pregnancy). What happens before the procedure? Medicines  Ask your health care provider about: ? Changing or stopping your regular medicines. This is especially important if you are taking diabetes medicines or blood thinners. ? Taking medicines such as aspirin and ibuprofen. These medicines can thin your blood. Do not take these medicines unless your health care provider tells you to take them. ? Taking over-the-counter medicines, vitamins, herbs, and supplements. Staying hydrated  Follow instructions from your health care provider about hydration, which may include: ? Up to 2 hours before the procedure - you may continue to drink clear liquids, such as water, clear fruit juice, black coffee, and plain tea. Eating and  drinking  Follow instructions from your health care provider about eating and drinking, which may include: ? 8 hours before the procedure - stop eating heavy meals or foods, such as meat, fried foods, or fatty foods. ? 6 hours before the procedure - stop eating light meals or foods, such as toast or cereal. ? 6 hours before the procedure - stop drinking milk or drinks that contain milk. ? 2 hours before the procedure - stop drinking clear liquids. General instructions  Do not use any products that contain nicotine or tobacco for at least 4 weeks before the procedure. These products include cigarettes, e-cigarettes, and chewing tobacco. If you need help quitting, ask your health care provider.  Plan to have someone take you home from the hospital.  If you will be going home right after the procedure, plan to have someone with you for 24 hours.  Ask your health care provider: ? How your surgery site will be marked. ? What steps will be taken to help prevent infection. These may include:  Removing hair at the surgery site.  Washing skin with a germ-killing soap.  Taking antibiotic medicine. What happens during the procedure?      An IV will be inserted into one of your veins.  You will be given one or more of the following: ? A medicine to help you relax (sedative). ? A medicine to numb the area (local anesthetic). ? A medicine to make you fall asleep (general anesthetic). ? A medicine that is injected into an area of your body to numb everything below the injection site (regional anesthetic).  Your bladder may be emptied with a small tube (  catheter).  If you have been given a general anesthetic, a tube will be put down your throat to help you breathe.  Two small incisions will be made in your lower abdomen and near your belly button.  Your abdomen will be inflated with a gas. This will let the surgeon see better and will give the surgeon room to work.  A thin, lighted tube  (laparoscope) with a camera attached will be inserted into your abdomen through one of the incisions. Small instruments will be inserted through the other incision.  The fallopian tubes will be tied off, burned (cauterized), or blocked with a clip, ring, or clamp. A small portion in the center of each fallopian tube may be removed.  The gas will be released from the abdomen.  The incisions will be closed with stitches (sutures).  A bandage (dressing) will be placed over the incisions. The procedure may vary among health care providers and hospitals. What happens after the procedure?  Your blood pressure, heart rate, breathing rate, and blood oxygen level will be monitored until you leave the hospital.  You will be given medicine to help with pain, nausea, and vomiting as needed. Summary  Laparoscopic tubal ligation is a procedure that is done so that you cannot get pregnant.  You should not have this procedure if you want to get pregnant someday or if you are unsure about having more children.  The procedure is done using a thin, lighted tube (laparoscope) with a camera attached that will be inserted into your abdomen through an incision.  Follow instructions from your health care provider about eating and drinking before the procedure. This information is not intended to replace advice given to you by your health care provider. Make sure you discuss any questions you have with your health care provider. Document Released: 12/06/2000 Document Revised: 07/25/2018 Document Reviewed: 07/25/2018 Elsevier Patient Education  [place] Instructions  Activity: Get plenty of rest for the remainder of the day. A responsible individual must stay with you for 24 hours following the procedure.  For the next 24 hours, DO NOT: -Drive a car -Paediatric nurse -Drink alcoholic beverages -Take any medication unless instructed by your physician -Make any legal  decisions or sign important papers.  Meals: Start with liquid foods such as gelatin or soup. Progress to regular foods as tolerated. Avoid greasy, spicy, heavy foods. If nausea and/or vomiting occur, drink only clear liquids until the nausea and/or vomiting subsides. Call your physician if vomiting continues.  Special Instructions/Symptoms: Your throat may feel dry or sore from the anesthesia or the breathing tube placed in your throat during surgery. If this causes discomfort, gargle with warm salt water. The discomfort should disappear within 24 hours.  If you had a scopolamine patch placed behind your ear for the management of post- operative nausea and/or vomiting:  1. The medication in the patch is effective for 72 hours, after which it should be removed.  Wrap patch in a tissue and discard in the trash. Wash hands thoroughly with soap and water. 2. You may remove the patch earlier than 72 hours if you experience unpleasant side effects which may include dry mouth, dizziness or visual disturbances. 3. Avoid touching the patch. Wash your hands with soap and water after contact with the patch.

## 2019-07-19 NOTE — Anesthesia Postprocedure Evaluation (Signed)
Anesthesia Post Note  Patient: Gina Cook  Procedure(Cook) Performed: LAPAROSCOPIC TUBAL LIGATION, REMOVAL OF NEXPLANON FROM THE LEFT ARM (Bilateral Abdomen)     Patient location during evaluation: PACU Anesthesia Type: General Level of consciousness: awake and alert Pain management: pain level controlled Vital Signs Assessment: post-procedure vital signs reviewed and stable Respiratory status: spontaneous breathing, nonlabored ventilation, respiratory function stable and patient connected to nasal cannula oxygen Cardiovascular status: blood pressure returned to baseline and stable Postop Assessment: no apparent nausea or vomiting Anesthetic complications: no    Last Vitals:  Vitals:   07/19/19 1451 07/19/19 1500  BP:    Pulse:    Resp:    Temp: 36.4 C   SpO2: 92% 91%    Last Pain:  Vitals:   07/19/19 1500  TempSrc:   PainSc: 7                  Gina Cook

## 2019-07-19 NOTE — Op Note (Signed)
  Indications: Gina Cook is a 33 y.o. female with diagnosis of multiparity.  Pre-operative Diagnosis: Multiparity desires sterilization  Post-operative Diagnosis: same  Surgeon: IRCVELF,YBOFB A   Assistants: none  Anesthesia: General endotracheal anesthesia   Procedure Details  The patient was seen in the Holding Room. The risks, benefits, complications, treatment options, and expected outcomes were discussed with the patient. The possibilities of reaction to medication, pulmonary aspiration, perforation of viscus, bleeding, recurrent infection, the need for additional procedures, failure to diagnose a condition, and creating a complication requiring transfusion or operation were discussed with the patient. The patient concurred with the proposed plan, giving informed consent. The patient was taken to the Operating Room, identified as Gina Cook and the procedure verified as Diagnostic Laparoscopy with BTL  And removal of nexplanon. A Time Out was held and the above information confirmed.  After induction of general anesthesia, the patient was placed in modified dorsal lithotomy position where she was prepped, draped, and catheterized in the normal, sterile fashion. A foley catheter was placed..  The patients left arm was prepped  With betadine.  1 cc marcaine was placed just over the nexplanon in the skin.  A small incision was made and the nexplanon was removed with hemostat.  Incision closed with a steri strip.  bandiad placed over the steri strip.    The cervix was visualized and an intrauterine manipulator was placed. A 2 cm umbilical incision was then performed.and carried down to the fascia.  The fascia was then opened and extended bilaterally.  Peritoneum was then entered.  o vicryl was then placed around the fascia in a circumferential fashion.   The hasson was placed and ancored to the suture.. Normal pelvic anatomy was noted.   Uterus,tubes, ovaries and appendix apperared  normal.   The anterior and  Posterior culdesac and liver appeared normal.   Both fallopian tubes were identified and fulgurated with the kleppingers in the  mid isthmic portion of the tubes.    Following the procedure the umbilical hasson was removed after intra-abdominal carbon dioxide was expressed. The fascia was reaproximated by tying the 0 vicryl suture.   The 83mm skin incision was closed with dermabond.  The 10 mm incision was closed with a  subcuticular suture of 3-0 monocryl. The intrauterine manipulator was then removed.  The tenaculum site was hemostatic.   Instrument, sponge, and needle counts were correct prior to abdominal closure and at the conclusion of the case.  Findings: See above Estimated Blood Loss:  Minimal         Drains: none         Total IV Fluids:Intravenous fluids were administered, normal saline 15000 ml's.         Specimens: none              Complications:  None; patient tolerated the procedure well.         Disposition: PACU - hemodynamically stable.         Condition: stable

## 2019-07-19 NOTE — Anesthesia Preprocedure Evaluation (Signed)
Anesthesia Evaluation  Patient identified by MRN, date of birth, ID band Patient awake    Reviewed: Allergy & Precautions, NPO status , Patient's Chart, lab work & pertinent test results  Airway Mallampati: II  TM Distance: >3 FB Neck ROM: Full    Dental no notable dental hx.    Pulmonary neg pulmonary ROS, former smoker,    Pulmonary exam normal breath sounds clear to auscultation       Cardiovascular negative cardio ROS Normal cardiovascular exam Rhythm:Regular Rate:Normal     Neuro/Psych negative neurological ROS  negative psych ROS   GI/Hepatic negative GI ROS, Neg liver ROS,   Endo/Other  negative endocrine ROS  Renal/GU negative Renal ROS  negative genitourinary   Musculoskeletal negative musculoskeletal ROS (+)   Abdominal   Peds negative pediatric ROS (+)  Hematology negative hematology ROS (+)   Anesthesia Other Findings   Reproductive/Obstetrics negative OB ROS                            Anesthesia Physical Anesthesia Plan  ASA: II  Anesthesia Plan: General   Post-op Pain Management:    Induction: Intravenous  PONV Risk Score and Plan: 2 and Ondansetron, Dexamethasone and Treatment may vary due to age or medical condition  Airway Management Planned: Oral ETT  Additional Equipment:   Intra-op Plan:   Post-operative Plan: Extubation in OR  Informed Consent: I have reviewed the patients History and Physical, chart, labs and discussed the procedure including the risks, benefits and alternatives for the proposed anesthesia with the patient or authorized representative who has indicated his/her understanding and acceptance.   Dental advisory given  Plan Discussed with: CRNA and Surgeon  Anesthesia Plan Comments:         Anesthesia Quick Evaluation  

## 2019-07-19 NOTE — Transfer of Care (Signed)
Immediate Anesthesia Transfer of Care Note  Patient: Gina Cook  Procedure(s) Performed: Procedure(s) (LRB): LAPAROSCOPIC TUBAL LIGATION, REMOVAL OF NEXPLANON FROM THE LEFT ARM (Bilateral)  Patient Location: PACU  Anesthesia Type: General  Level of Consciousness: awake, sedated, patient cooperative and responds to stimulation  Airway & Oxygen Therapy: Patient Spontanous Breathing and Patient connected to [place] 02  Post-op Assessment: Report given to PACU RN, Post -op Vital signs reviewed and stable and Patient moving all extremities  Post vital signs: Reviewed and stable  Complications: No apparent anesthesia complications

## 2019-07-19 NOTE — Anesthesia Procedure Notes (Signed)
Procedure Name: Intubation Date/Time: 07/19/2019 1:36 PM Performed by: Justice Rocher, CRNA Pre-anesthesia Checklist: Patient identified, Emergency Drugs available, Suction available and Patient being monitored Patient Re-evaluated:Patient Re-evaluated prior to induction Oxygen Delivery Method: Circle system utilized Preoxygenation: Pre-oxygenation with 100% oxygen Induction Type: IV induction Ventilation: Mask ventilation without difficulty Laryngoscope Size: Mac and 3 Grade View: Grade II Tube type: Oral Tube size: 7.0 mm Number of attempts: 1 Airway Equipment and Method: Stylet and Oral airway Placement Confirmation: ETT inserted through vocal cords under direct vision,  positive ETCO2 and breath sounds checked- equal and bilateral Secured at: 23 cm Tube secured with: Tape Dental Injury: Teeth and Oropharynx as per pre-operative assessment

## 2019-07-19 NOTE — OR Nursing (Signed)
Nexplanon implant was removed by Dr. Charlesetta Garibaldi from her left arm

## 2019-07-20 ENCOUNTER — Encounter (HOSPITAL_BASED_OUTPATIENT_CLINIC_OR_DEPARTMENT_OTHER): Payer: Self-pay | Admitting: Obstetrics and Gynecology

## 2019-07-26 LAB — POCT HEMOGLOBIN-HEMACUE: Hemoglobin: 13.1 g/dL (ref 12.0–15.0)

## 2019-07-31 ENCOUNTER — Other Ambulatory Visit: Payer: Self-pay

## 2019-07-31 ENCOUNTER — Emergency Department (HOSPITAL_COMMUNITY)
Admission: EM | Admit: 2019-07-31 | Discharge: 2019-08-01 | Disposition: A | Discharge status: Home / Self Care | Payer: Medicaid Other | Attending: Emergency Medicine | Admitting: Emergency Medicine

## 2019-07-31 ENCOUNTER — Emergency Department (HOSPITAL_COMMUNITY): Payer: Medicaid Other

## 2019-07-31 ENCOUNTER — Encounter (HOSPITAL_COMMUNITY): Payer: Self-pay

## 2019-07-31 DIAGNOSIS — Z87891 Personal history of nicotine dependence: Secondary | ICD-10-CM | POA: Insufficient documentation

## 2019-07-31 DIAGNOSIS — Z20822 Contact with and (suspected) exposure to covid-19: Secondary | ICD-10-CM

## 2019-07-31 DIAGNOSIS — R030 Elevated blood-pressure reading, without diagnosis of hypertension: Secondary | ICD-10-CM | POA: Insufficient documentation

## 2019-07-31 DIAGNOSIS — R0602 Shortness of breath: Secondary | ICD-10-CM | POA: Diagnosis present

## 2019-07-31 DIAGNOSIS — Z20828 Contact with and (suspected) exposure to other viral communicable diseases: Secondary | ICD-10-CM | POA: Diagnosis not present

## 2019-07-31 DIAGNOSIS — I10 Essential (primary) hypertension: Secondary | ICD-10-CM

## 2019-07-31 LAB — CBC
HCT: 41.6 % (ref 36.0–46.0)
Hemoglobin: 12.8 g/dL (ref 12.0–15.0)
MCH: 24.9 pg — ABNORMAL LOW (ref 26.0–34.0)
MCHC: 30.8 g/dL (ref 30.0–36.0)
MCV: 80.9 fL (ref 80.0–100.0)
Platelets: 216 10*3/uL (ref 150–400)
RBC: 5.14 MIL/uL — ABNORMAL HIGH (ref 3.87–5.11)
RDW: 15.3 % (ref 11.5–15.5)
WBC: 4.6 10*3/uL (ref 4.0–10.5)
nRBC: 0 % (ref 0.0–0.2)

## 2019-07-31 LAB — BASIC METABOLIC PANEL
Anion gap: 8 (ref 5–15)
BUN: 14 mg/dL (ref 6–20)
CO2: 24 mmol/L (ref 22–32)
Calcium: 8.8 mg/dL — ABNORMAL LOW (ref 8.9–10.3)
Chloride: 108 mmol/L (ref 98–111)
Creatinine, Ser: 0.55 mg/dL (ref 0.44–1.00)
GFR calc Af Amer: >60 mL/min (ref >60)
GFR calc non Af Amer: >60 mL/min (ref >60)
Glucose, Bld: 94 mg/dL (ref 70–99)
Potassium: 4 mmol/L (ref 3.5–5.1)
Sodium: 140 mmol/L (ref 135–145)

## 2019-07-31 LAB — TROPONIN I (HIGH SENSITIVITY): Troponin I (High Sensitivity): <2 ng/L (ref <18)

## 2019-07-31 LAB — D-DIMER, QUANTITATIVE: D-Dimer, Quant: 0.97 ug/mL-FEU — ABNORMAL HIGH (ref 0.00–0.50)

## 2019-07-31 MED ORDER — ASPIRIN 81 MG PO CHEW
324.0000 mg | CHEWABLE_TABLET | Freq: Once | ORAL | Status: AC
  Administered 2019-07-31: 23:00:00 324 mg via ORAL
  Filled 2019-07-31: qty 4

## 2019-07-31 NOTE — ED Triage Notes (Addendum)
Pt arrives POV from home with complaints of chest pain, SHOB, and headache since Tubal ligation on 11/4. Pt reports some symptoms since surgery but SHOB and Chest pain that extends into the back has been progressively worse. Pt denies fevers. Pt additionally, reports hypertension

## 2019-07-31 NOTE — ED Provider Notes (Signed)
"Gina Cook is a 33 y.o. female with history of pregnancy-induced hypertension previously on hypertensive medicines presents to the ER for several complaints since 11/5.    Reports daily headaches, nausea, cold chills, chest pain, back pain, shortness of breath. She is also concerned about her high blood pressure.  Symptoms onset since 11/5 when she had GYN procedure tubal ligation.  She had a telemedicine appointment with referred her to the ER for evaluation of her symptoms.    Her chest pain began 7 days ago.  Described as pressure like somebody is holding her chest down.  Localized in the center, nonradiating.  Pain was intermittent, lasting 1 to 2 hours however today chest pain has been constant.  Chest discomfort is 7/10 currently.  Chest pressure is worse with inhaling, doing chores at home moving around, sitting up, laying flat, coughing.  No pain in the chest after eating.  Describes shortness of breath as if someone is pinching her nose down or there is mucus in her nose.  Has tried to blow her nose but does not have any rhinorrhea.  When she breathes through her mouth her breathing seems normal.  She had cold chills last night with nausea, resolved. She had Nexplanon implant discontinued day of surgery on 11/5.  No history of DVT or PE.  No other birth control.  No recent prolonged travel, immobilization, lower extremity swelling or calf pain, hemoptysis.  No associated infectious symptoms like fever, cough, sore throat, vomiting, abdominal pain, diarrhea. No COVID exposure or travel.  No tobacco use.  She has no other medical history.  No family history of CAD.  She is to be on blood pressure medicines during her last pregnancy 1 year ago, she was discharged from [facility] practice and advised to follow-up with PCP for ongoing management of her high blood pressure however patient has not made an appointment and does not have a PCP.  In regards to her back pain, this is localized in the low back.   States she has chronic back pain that usually gets worse when she moves around, is doing chores or lifting.  Her back pain is different today because it has been constant since her surgery.  Back pain is positional.  No changes in bowel movements, urinary symptoms, falls or trauma.    Headaches have been daily, constant, generalized. She takes tylenol and headache improves slightly.  No current headache. No neck pain or stiffness, vision changes, stroke symptoms. She has numbness in tip of tongue since 11/5 surgery, surgeon told her it was from anesthesia."   Physical Exam  BP 124/85   Pulse 84   Temp 97.9 F (36.6 C) (Oral)   Resp 13   Ht 5\' 2"  (1.575 m)   Wt 86.2 kg   SpO2 98%   BMI 34.75 kg/m   Physical Exam Vitals signs and nursing note reviewed.  Constitutional:      General: She is not in acute distress.    Comments: NAD.  HENT:     Head: Normocephalic.  Eyes:     Conjunctiva/sclera: Conjunctivae normal.  Neck:     Musculoskeletal: Neck supple.  Cardiovascular:     Rate and Rhythm: Normal rate and regular rhythm.     Heart sounds: No murmur. No friction rub. No gallop.   Pulmonary:     Effort: Pulmonary effort is normal. No respiratory distress.     Comments: No increased work of breathing.  No respiratory distress. Abdominal:  General: There is no distension.     Palpations: Abdomen is soft.  Skin:    Findings: No rash.  Neurological:     Mental Status: She is alert.  Psychiatric:        Behavior: Behavior normal.     ED Course/Procedures   Clinical Course as of Jul 31 452  Tue Jul 31, 2019  2142 Sinus rhythm Borderline left axis deviation Borderline T abnormalities, inferior leads Baseline wander in lead(s) V3 Confirmed by Geoffery Lyons ([id number]) on 07/31/2019 6:37:59 PM  EKG 12-Lead [CG]  2323 Sinus rhythm Borderline left axis deviation Borderline T abnormalities, inferior leads Baseline wander in lead(s) V3 Confirmed by Geoffery Lyons ([id number]) on  07/31/2019 6:37:59 PM  DG Chest Port 1 View [CG]  2323 D-Dimer, Quant(!): 0.97 [CG]    Clinical Course User Index [CG] Liberty Handy, PA-C    Procedures  MDM   33 year old female received at signout from [place] S[facility] pending CT angio of the chest.  Please see her notes for further work-up and medical decision making.  No PE is seen on study.  However, there are patchy bilateral groundglass airspace opacities with slight peripheral predominance concerning for multifocal pneumonia that could be COVID-19 pneumonia in the appropriate clinical setting.  These findings were discussed with the patient.  She is aware that COVID-19 test is pending, but COVID-19 quarantine guidelines were discussed with patient.  In the ER, her blood pressure is now 124/85.  She is satting at 98% on room air with no shortness of breath.  Will discharge with prescription for an albuterol inhaler and spacer.  She was advised to take Tylenol and ibuprofen she develops a fever and for body aches.  She is hemodynamically stable and in no acute distress.  Safe for discharge home with outpatient follow-up as needed.   Barkley Boards, PA-C 08/01/19 0453    Dione Booze, MD 08/01/19 [phone]

## 2019-07-31 NOTE — ED Provider Notes (Addendum)
[facility]-EMERGENCY DEPT Provider Note   CSN: [id number] Arrival date & time: 07/31/19  1811     History   Chief Complaint Chief Complaint  Patient presents with   Chest Pain   Shortness of Breath   Headache   HPI Gina Cook is a 33 y.o. female with history of pregnancy-induced hypertension previously on hypertensive medicines presents to the ER for several complaints since 11/5.    Reports daily headaches, nausea, cold chills, chest pain, back pain, shortness of breath. She is also concerned about her high blood pressure.  Symptoms onset since 11/5 when she had GYN procedure tubal ligation.  She had a telemedicine appointment with referred her to the ER for evaluation of her symptoms.    Her chest pain began 7 days ago.  Described as pressure, central like somebody is holding her chest down.  Localized in the center, nonradiating.  Pain was intermittent, lasting 1 to 2 hours however today chest pain has been constant.  Chest discomfort is 7/10 currently.  Chest pressure is worse with inhaling, doing chores at home moving around, sitting up, laying flat, coughing.  No pain in the chest after eating.  Describes shortness of breath as if someone is pinching her nose down or there is mucus in her nose.  Has tried to blow her nose but does not have any rhinorrhea.  When she breathes through her mouth her breathing seems normal.  She had cold chills last night with nausea, resolved. She had Nexplanon implant discontinued day of surgery on 11/5.  No history of DVT or PE.  No other birth control.  No recent prolonged travel, immobilization, lower extremity swelling or calf pain, hemoptysis.  No associated infectious symptoms like fever, cough, sore throat, vomiting, abdominal pain, diarrhea. No COVID exposure or travel.  No tobacco use.  She has no other medical history.  No family history of CAD.  She is to be on blood pressure medicines during her last pregnancy 1 year  ago, she was discharged from [facility] practice and advised to follow-up with PCP for ongoing management of her high blood pressure however patient has not made an appointment and does not have a PCP.  In regards to her back pain, this is localized in the low back.  States she has chronic back pain that usually gets worse when she moves around, is doing chores or lifting.  Her back pain is different today because it has been constant since her surgery.  Back pain is positional.  No changes in bowel movements, urinary symptoms, falls or trauma, no distal tingling or weakness.   Headaches have been daily, constant, generalized. She takes tylenol and headache improves slightly.  No current headache. No neck pain or stiffness, vision changes, stroke symptoms. She has numbness in tip of tongue since 11/5 surgery, surgeon told her it was from anesthesia.       HPI  Past Medical History:  Diagnosis Date   History of blood transfusion    History of chlamydia    History of postpartum hemorrhage    History of pregnancy induced hypertension    IDA (iron deficiency anemia)    Wears glasses     Patient Active Problem List   Diagnosis Date Noted   Chronic hypertension with superimposed pre-eclampsia 05/19/2018   Anemia, postpartum 05/19/2018   Normal labor 05/17/2018    Past Surgical History:  Procedure Laterality Date   LAPAROSCOPIC TUBAL LIGATION Bilateral 07/19/2019   Procedure: LAPAROSCOPIC TUBAL LIGATION;  Surgeon: Crawford Givens, MD;  Location: [facility];  Service: Gynecology;  Laterality: Bilateral;   MOUTH SURGERY  09/13/2017   NO PAST SURGERIES     REMOVAL OF DRUG DELIVERY IMPLANT Left 07/19/2019   Procedure: REMOVAL OF DRUG DELIVERY IMPLANT;  Surgeon: Crawford Givens, MD;  Location: [facility];  Service: Gynecology;  Laterality: Left;  Nexplanon implant     OB History    Gravida  5   Para  4   Term  4   Preterm      AB  1   Living  4       SAB  1   TAB      Ectopic      Multiple  0   Live Births  1            Home Medications    Prior to Admission medications   Medication Sig Start Date End Date Taking? Authorizing Provider  acetaminophen (TYLENOL) 500 MG tablet Take 1,000 mg by mouth every 6 (six) hours as needed for mild pain.   Yes [provider]  ibuprofen (ADVIL) 600 MG tablet Take 1 tablet (600 mg total) by mouth every 6 (six) hours as needed. Patient taking differently: Take 600 mg by mouth every 6 (six) hours as needed for moderate pain.  07/19/19  Yes Dillard, Naima, MD  oxycodone-acetaminophen (PERCOCET) 2.5-325 MG tablet Take 1 tablet by mouth every 4 (four) hours as needed for pain. 07/19/19  Yes DillardGwynneth Munson, MD    Family History Family History  Problem Relation Age of Onset   Hypertension Mother     Social History Social History   Tobacco Use   Smoking status: Former Smoker    Types: Cigarettes    Quit date: 07/17/2016    Years since quitting: 3.0   Smokeless tobacco: Never Used   Tobacco comment: was socially  Substance Use Topics   Alcohol use: Not Currently   Drug use: Never     Allergies   Patient has no known allergies.   Review of Systems Review of Systems  Constitutional: Positive for chills.  HENT: Positive for congestion.   Respiratory: Positive for shortness of breath.   Cardiovascular: Positive for chest pain (pressure).  Gastrointestinal: Positive for nausea.  Neurological: Positive for headaches.  All other systems reviewed and are negative.    Physical Exam Updated Vital Signs BP (!) 140/105    Pulse 74    Temp 97.9 F (36.6 C) (Oral)    Resp (!) 21    Ht 5\' 2"  (1.575 m)    Wt 86.2 kg    SpO2 99%    BMI 34.75 kg/m   Physical Exam Vitals signs and nursing note reviewed.  Constitutional:      General: She is not in acute distress.    Appearance: She is well-developed.     Comments: NAD.  HENT:     Head: Normocephalic and  atraumatic.     Comments: No temporal tenderness    Right Ear: External ear normal.     Left Ear: External ear normal.     Nose: Nose normal.     Comments: No intranasal mucosal edema, erythema.  No sinus tenderness    Mouth/Throat:     Comments: Oropharynx and tonsils normal. Eyes:     General: No scleral icterus.    Conjunctiva/sclera: Conjunctivae normal.  Neck:     Musculoskeletal: Normal range of motion and neck supple.  Comments: No midline or paraspinal muscle tenderness. No meningismus  Cardiovascular:     Rate and Rhythm: Normal rate and regular rhythm.     Pulses:          Radial pulses are 1+ on the right side and 1+ on the left side.       Dorsalis pedis pulses are 1+ on the right side and 1+ on the left side.     Heart sounds: Normal heart sounds. No murmur.     Comments: Lower extremity edema.  No calf tenderness.  No chest wall tenderness. Pulmonary:     Effort: Pulmonary effort is normal.     Breath sounds: Normal breath sounds.  Abdominal:     Palpations: Abdomen is soft.     Tenderness: There is no abdominal tenderness.  Musculoskeletal: Normal range of motion.        General: No deformity.  Skin:    General: Skin is warm and dry.     Capillary Refill: Capillary refill takes less than 2 seconds.  Neurological:     Mental Status: She is alert and oriented to person, place, and time.     Comments:  Mental Status: Patient is awake, alert, oriented to person, place, year, and situation. Patient is able to give a clear and coherent history. Speech is fluent and clear.  Cranial Nerves: CN III-XIII grossly intact bilaterally.   Motor: Strength 5/5 in upper/lower extremities.  Sensation to light touch intact in face, upper/lower extremities. No pronator drift. No leg drop.  Cerebellar: No ataxia with finger to nose.  Psychiatric:        Behavior: Behavior normal.        Thought Content: Thought content normal.        Judgment: Judgment normal.      ED  Treatments / Results  Labs (all labs ordered are listed, but only abnormal results are displayed) Labs Reviewed  BASIC METABOLIC PANEL - Abnormal; Notable for the following components:      Result Value   Calcium 8.8 (*)    All other components within normal limits  CBC - Abnormal; Notable for the following components:   RBC 5.14 (*)    MCH 24.9 (*)    All other components within normal limits  D-DIMER, QUANTITATIVE (NOT AT [facility]) - Abnormal; Notable for the following components:   D-Dimer, Quant 0.97 (*)    All other components within normal limits  SARS CORONAVIRUS 2 (TAT 6-24 HRS)  TROPONIN I (HIGH SENSITIVITY)  TROPONIN I (HIGH SENSITIVITY)    EKG EKG Interpretation  Date/Time:  Tuesday July 31 2019 18:29:56 EST Ventricular Rate:  78 PR Interval:    QRS Duration: 101 QT Interval:  383 QTC Calculation: 437 R Axis:   -21 Text Interpretation: Sinus rhythm Borderline left axis deviation Borderline T abnormalities, inferior leads Baseline wander in lead(s) V3 Confirmed by Geoffery LyonseLo, Douglas ([id number]) on 07/31/2019 6:37:59 PM   Radiology Dg Chest Port 1 View  Result Date: 07/31/2019 CLINICAL DATA:  Chest pain and shortness of breath for 1 week EXAM: PORTABLE CHEST 1 VIEW COMPARISON:  None. FINDINGS: The heart size and mediastinal contours are within normal limits. Both lungs are clear. The visualized skeletal structures are unremarkable. IMPRESSION: No active disease. Electronically Signed   By: Duanne GuessNicholas  Plundo M.D.   On: 07/31/2019 21:59    Procedures Procedures (including critical care time)  Medications Ordered in ED Medications  aspirin chewable tablet 324 mg (324 mg Oral Given 07/31/19 2248)  Initial Impression / Assessment and Plan / ED Course  I have reviewed the triage vital signs and the nursing notes.  Pertinent labs & imaging results that were available during my care of the patient were reviewed by me and considered in my medical decision making (see  chart for details).  Clinical Course as of Nov 18 0001  Tue Jul 31, 2019  2142 Sinus rhythm Borderline left axis deviation Borderline T abnormalities, inferior leads Baseline wander in lead(s) V3 Confirmed by Geoffery Lyons ([id number]) on 07/31/2019 6:37:59 PM  EKG 12-Lead [CG]  2323 Sinus rhythm Borderline left axis deviation Borderline T abnormalities, inferior leads Baseline wander in lead(s) V3 Confirmed by Geoffery Lyons ([id number]) on 07/31/2019 6:37:59 PM  DG Chest Port 1 View [CG]  2323 D-Dimer, Quant(!): 0.97 [CG]    Clinical Course User Index [CG] Liberty Handy, PA-C   33 yo F s/p tubal ligation POD 13 presents with several complaints including headaches, chest pain, shortness of breath back pain, nausea, cold chills ever since surgery.  CP has been constant today, pleuritic, positional.  No cough, fevers. H/o pregnancy induced HTN non compliant with medicines.   Exam benign. Cardiopulmonary exam benign. No LE edema, calf tenderness. HD stable.  No signs of hypervolemia.  Neuro intact. No temporal tenderness. No meningismus.   Patient referred to ER by PCP today.    Overall chronicity of symptoms as well as other vague symptoms make life threatening process like ACS, PE, PNA, highly unlikely.    Her recent surgery was a day procedure and likely low risk for PE.    I considered intracranial emergency like SAH, meningitis highly unlikely. Initial hypertension has improved without intervention.  Neuro intact.  No fever or neck pain or meningismus.  I don't think emergent imaging of head indicated.  Doubt SAH, CVA, meningitis.    Will initiate CP work up.  Wells score 1.5, will add d-dimer although no tachycardia, tachypnea, hypoxia.    2345: ER work up thus far reviewed by me remarkable as above.  EKG non ischemic, without right heart strain or signs to suggest pericarditis.  CBC non acute.  CXR negative.  D-dimer 0.97.  Patient will required CTA to r/o PE.  Pending trop , BMP.  HEART score <3.   Patient reassessed and updated on work up thus far.  Understands shift change/transfer of care to oncoming EDPA.  Will hand off to night PA who will f/u on CTAP, trop, BMP and determine disposition. COVID test ordered given symptoms, recent surgery although unlikely.  Dc with symptomatic management of atypical CP possibly MSK, labetalol for chronic HTN.     Final Clinical Impressions(s) / ED Diagnoses   Final diagnoses:  Atypical chest pain  Elevated blood pressure reading with diagnosis of hypertension    ED Discharge Orders    None         Jerrell Mylar 08/01/19 0002    Tilden Fossa, MD 08/01/19 [phone]

## 2019-08-01 ENCOUNTER — Emergency Department (HOSPITAL_COMMUNITY): Payer: Medicaid Other

## 2019-08-01 ENCOUNTER — Encounter (HOSPITAL_COMMUNITY): Payer: Self-pay

## 2019-08-01 LAB — SARS CORONAVIRUS 2 (TAT 6-24 HRS): SARS Coronavirus 2: NEGATIVE

## 2019-08-01 LAB — TROPONIN I (HIGH SENSITIVITY): Troponin I (High Sensitivity): <2 ng/L (ref <18)

## 2019-08-01 MED ORDER — SODIUM CHLORIDE (PF) 0.9 % IJ SOLN
INTRAMUSCULAR | Status: AC
  Filled 2019-08-01: qty 50

## 2019-08-01 MED ORDER — AEROCHAMBER PLUS MISC: 2 refills | Status: AC

## 2019-08-01 MED ORDER — IOHEXOL 350 MG/ML SOLN
100.0000 mL | Freq: Once | INTRAVENOUS | Status: AC | PRN
  Administered 2019-08-01: 01:00:00 100 mL via INTRAVENOUS

## 2019-08-01 MED ORDER — ALBUTEROL SULFATE HFA 108 (90 BASE) MCG/ACT IN AERS: 2.0000 | INHALATION_SPRAY | RESPIRATORY_TRACT | 0 refills | Status: AC | PRN

## 2019-08-01 NOTE — Discharge Instructions (Addendum)
Thank you for allowing me to care for you today in the Emergency Department.   Your COVID-19 test is pending.  If it is positive, you should receive a call from the hospital with the results.  However, you can recheck the results at any time I downloading the MyChart app.  If you have not created an account, you can use the activation code attached along with your discharge paperwork.  Take 650 mg of Tylenol or 600 mg of ibuprofen with food every 6 hours for fever and pain.  You can alternate between these 2 medications every 3 hours if your pain returns.  For instance, you can take Tylenol at noon, followed by a dose of ibuprofen at 3, followed by second dose of Tylenol and 6.  For shortness of breath, you can take 2 puffs of the albuterol inhaler with a spacer every 4 hours as needed.  You can take over-the-counter cough medication if you develop a cough.  Your blood pressure was mildly elevated in the ER.  This could be because of your symptoms.  However, I would call to schedule a follow-up appointment with your primary care provider for recheck in 1 week.  If your COVID-19 test is positive, you should quarantine or isolate at home for 14 days after your symptoms began.  I have attached the home quarantine precautions along with your discharge paperwork.  If you test positive, all of the other people in your household should also be tested for COVID-19 as well.  Return to the emergency department if you develop respiratory distress, if you pass out, severe chest pain, new numbness or weakness, or other new, concerning symptoms.

## 2020-11-06 IMAGING — DX DG CHEST 1V PORT
1 series · 1 of 1 positions shown · non-contrast
Comparison: None.

CLINICAL DATA: Chest pain and shortness of breath for 1 week

EXAM:
PORTABLE CHEST 1 VIEW

[chest ap]
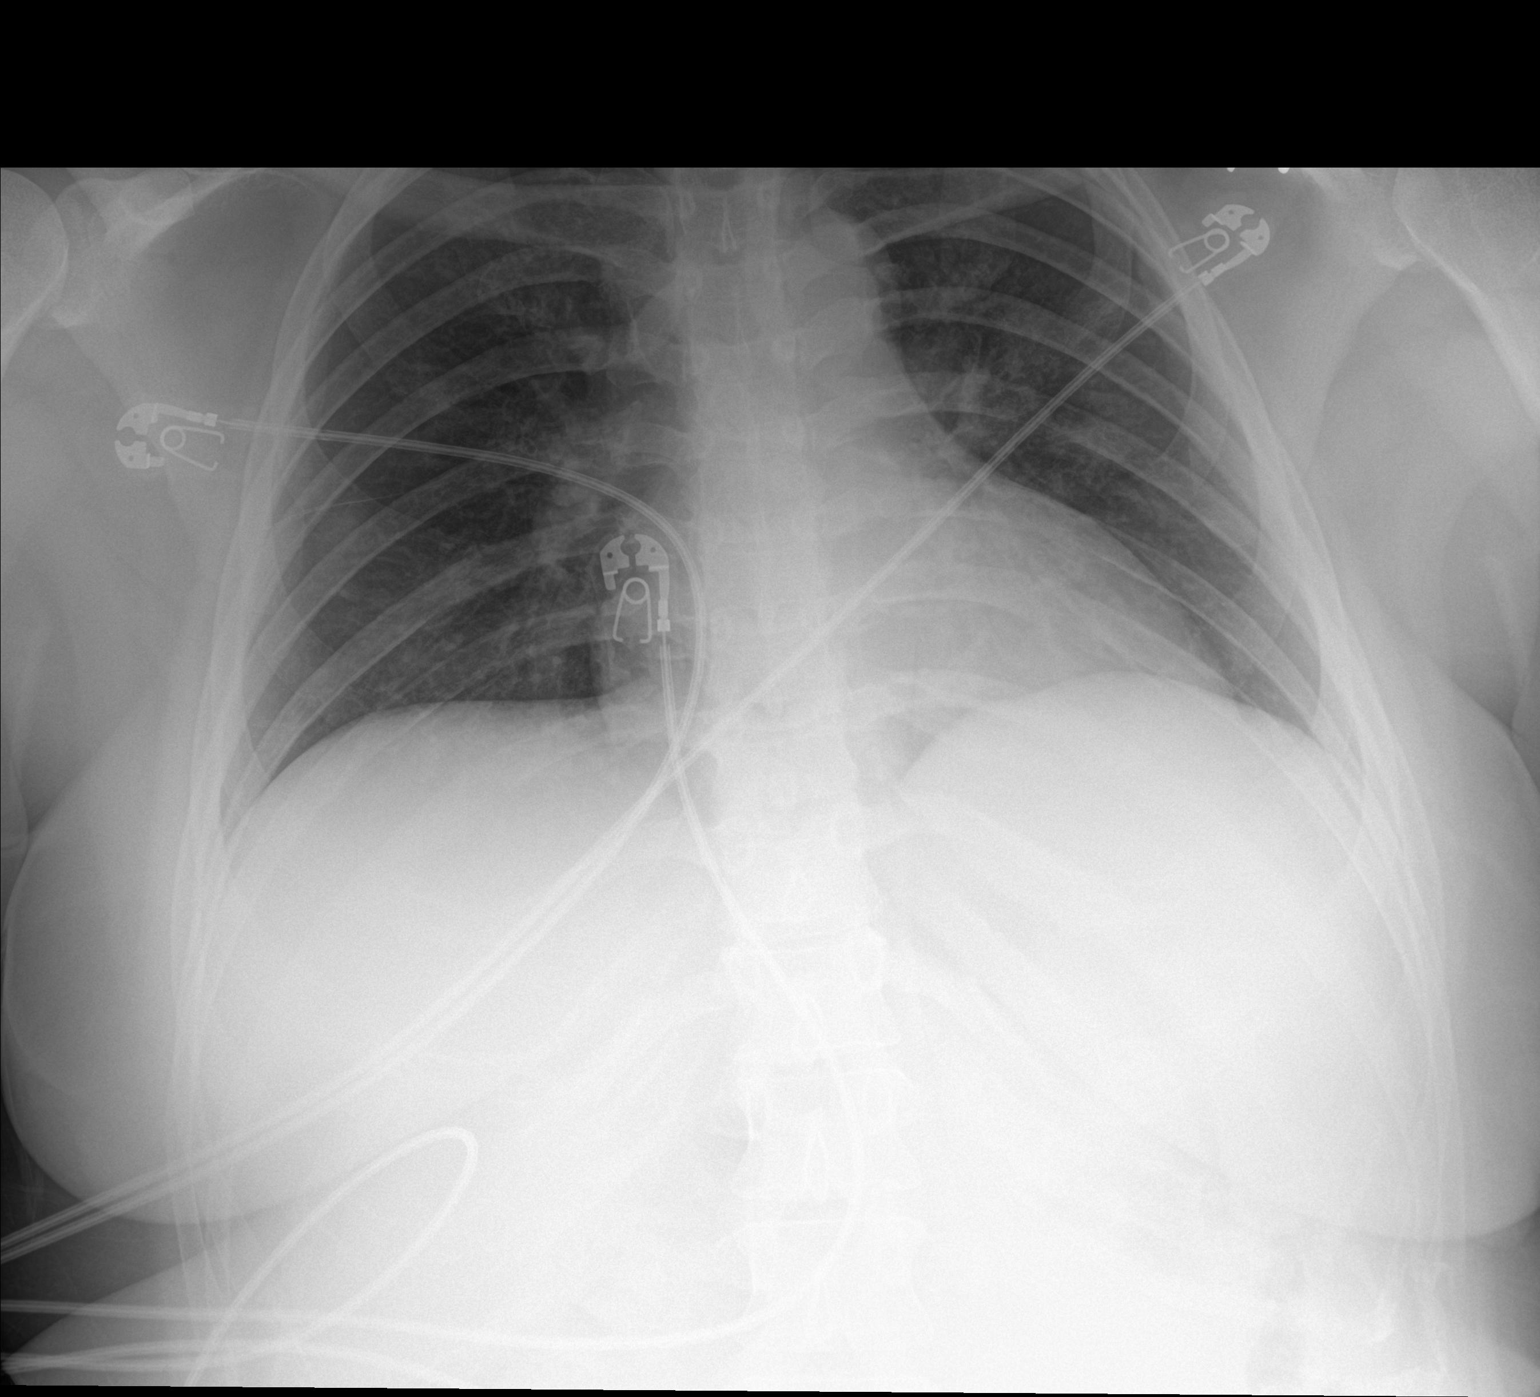

[1 of 1 positions shown; findings below may reference images not displayed]

FINDINGS: The heart size and mediastinal contours are within normal limits.
Both lungs are clear. The visualized skeletal structures are
unremarkable.
IMPRESSION: No active disease.

## 2020-11-07 IMAGING — CT CT ANGIO CHEST
2 of 6 series · 13 of 36 positions shown · IV contrast (OMNIPAQUE 350)
Comparison: Chest x-ray 07/31/2019

CLINICAL DATA: Chest pain, shortness of breath

EXAM:
CT ANGIOGRAPHY CHEST WITH CONTRAST
TECHNIQUE: Multidetector CT imaging of the chest was performed using the
standard protocol during bolus administration of intravenous
contrast. Multiplanar CT image reconstructions and MIPs were
obtained to evaluate the vascular anatomy.
CONTRAST:  100mL OMNIPAQUE IOHEXOL 350 MG/ML SOLN

[Series 4: axial st · axial · 0.63mm/px · z∈[+1502,+1680]mm · 12 of 107 slices shown]
[im 9/107  lung]
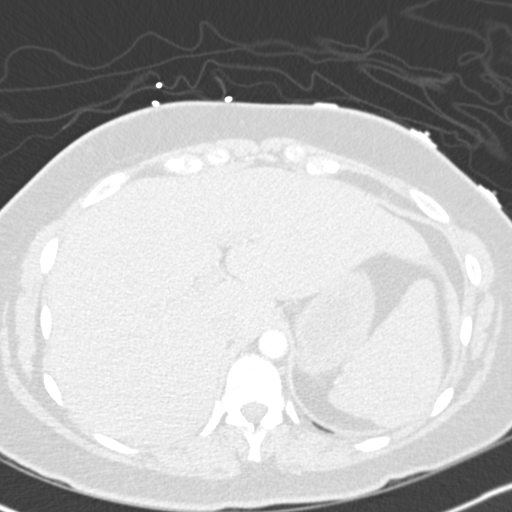
[im 17/107  mediastinal]
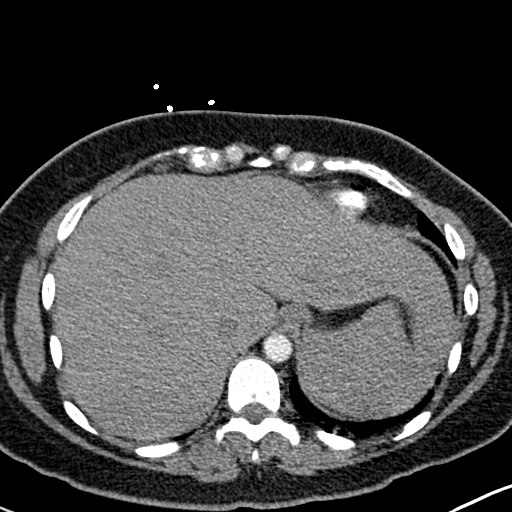
[im 25/107  lung]
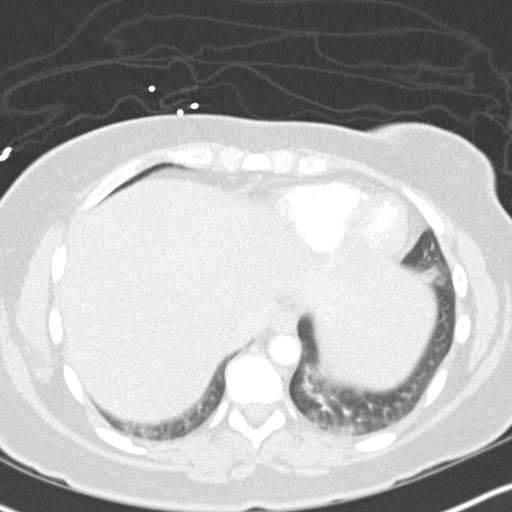
[im 33/107  mediastinal]
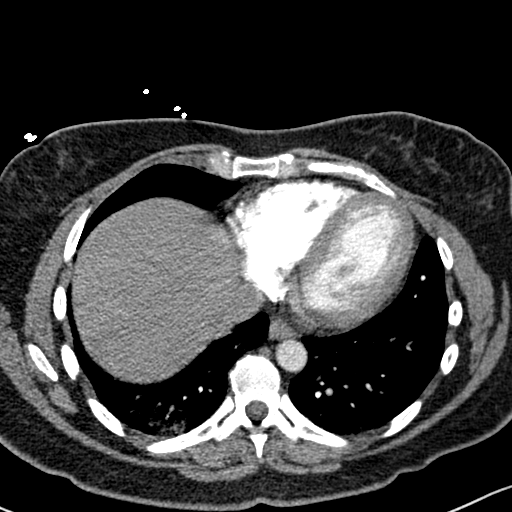
[im 41/107  lung]
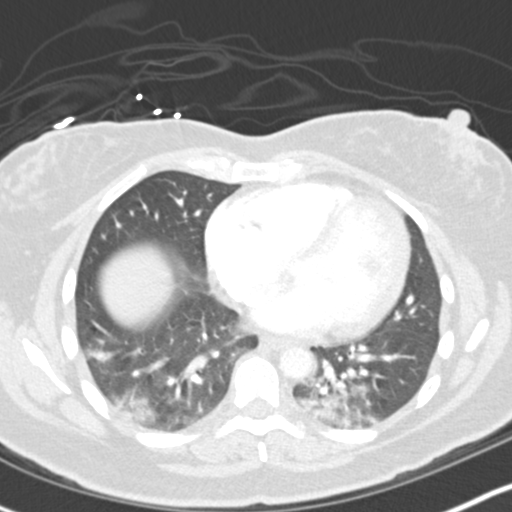
[im 49/107  mediastinal]
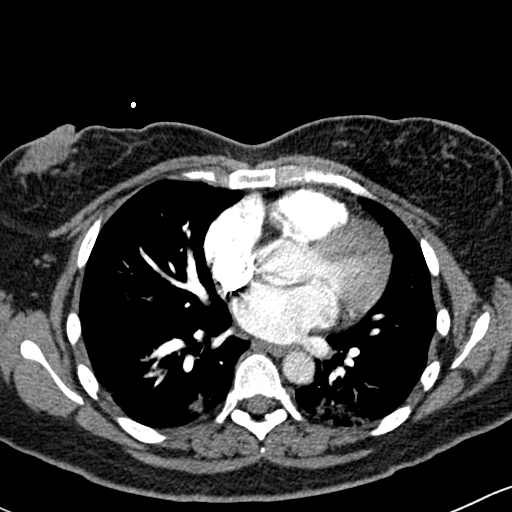
[im 58/107  lung]
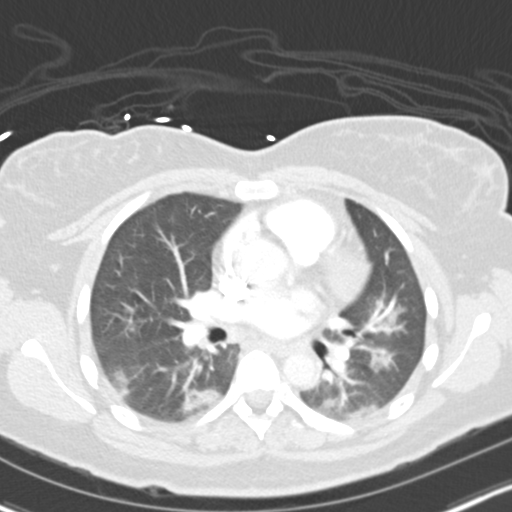
[im 66/107  mediastinal]
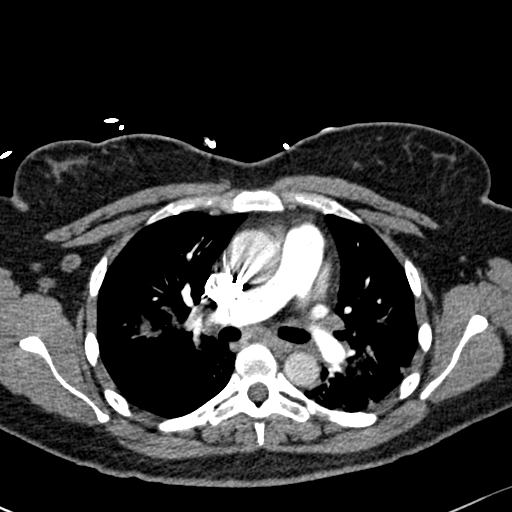
[im 74/107  lung]
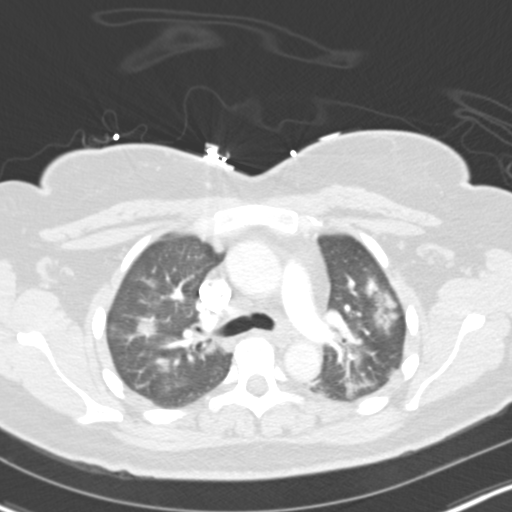
[im 82/107  mediastinal]
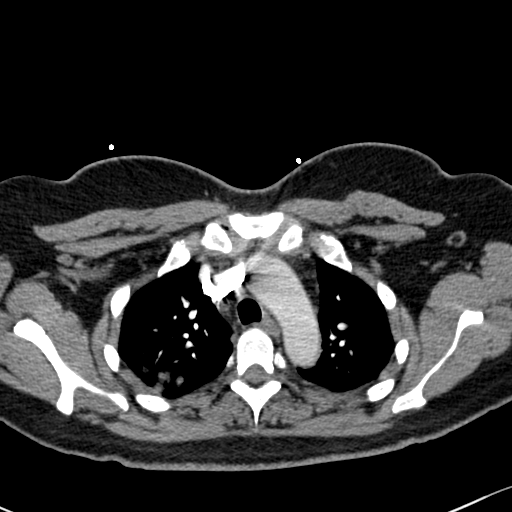
[im 90/107  lung]
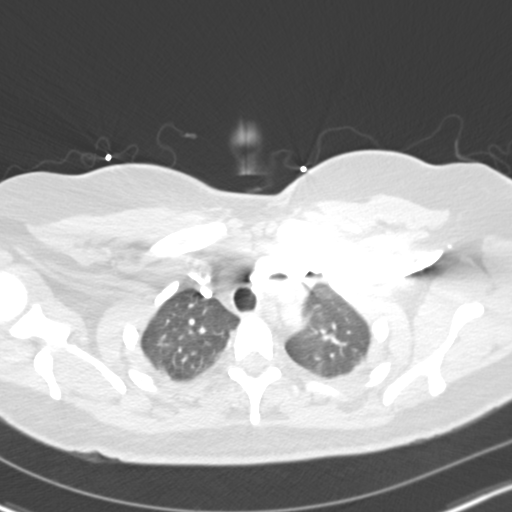
[im 98/107  mediastinal]
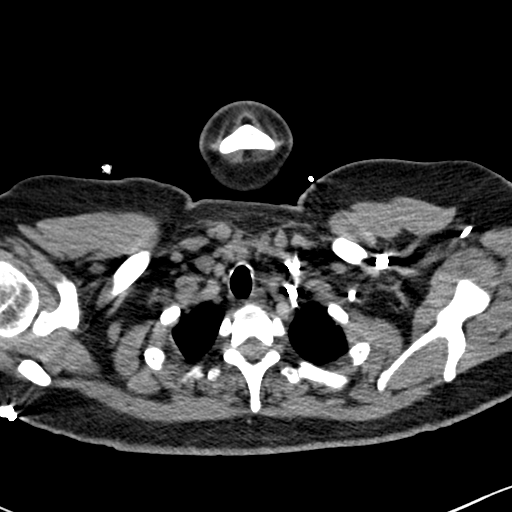

[Series 7: coronal mpr · coronal · 0.42mm/px · 1 of 118 slices shown]
[im 59/118  mediastinal]
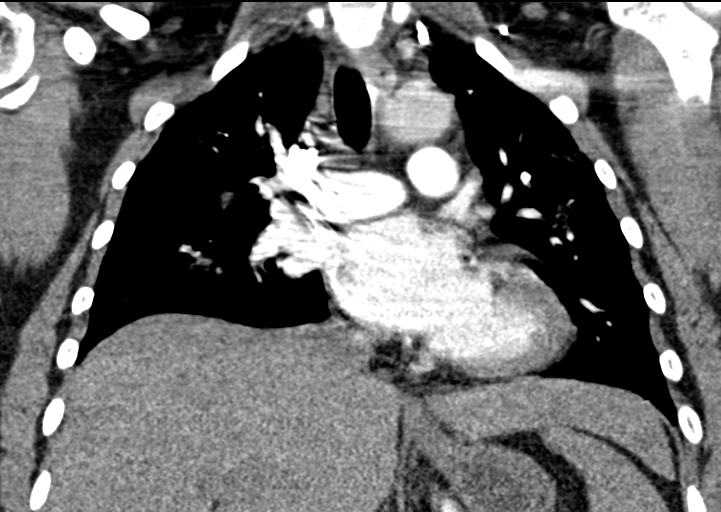

[13 of 36 positions shown; findings below may reference images not displayed]

FINDINGS: Cardiovascular: No filling defects in the pulmonary arteries. Heart
is normal size. Aorta is normal caliber.

Mediastinum/Nodes: No mediastinal, hilar, or axillary adenopathy.
Trachea and esophagus are unremarkable. Thyroid unremarkable.

Lungs/Pleura: There are patchy bilateral ground-glass airspace
opacities noted. Most of these are peripheral opacities. No
effusions.

Upper Abdomen: ZBmaging into the upper abdomen shows no acute
findings.

Musculoskeletal: Chest wall soft tissues are unremarkable. No acute
bony abnormality.

Review of the MIP images confirms the above findings.
IMPRESSION: No evidence of pulmonary embolus.

Patchy bilateral ground-glass airspace opacities with slight
peripheral predominance. This is concerning for multifocal
pneumonia. In the appropriate clinical setting, COVID pneumonia
could have this appearance. Recommend clinical correlation.
